# Patient Record
Sex: Male | Born: 1950 | Race: White | Hispanic: No | Marital: Married | Smoking: Never smoker
Health system: Southern US, Community
[De-identification: ages and names within clinical notes are randomized; demographics above are authoritative.]

## PROBLEM LIST (undated history)

## (undated) DIAGNOSIS — T7840XA Allergy, unspecified, initial encounter: Secondary | ICD-10-CM

## (undated) DIAGNOSIS — F419 Anxiety disorder, unspecified: Secondary | ICD-10-CM

## (undated) DIAGNOSIS — C801 Malignant (primary) neoplasm, unspecified: Secondary | ICD-10-CM

## (undated) DIAGNOSIS — M810 Age-related osteoporosis without current pathological fracture: Secondary | ICD-10-CM

## (undated) DIAGNOSIS — Z789 Other specified health status: Secondary | ICD-10-CM

## (undated) DIAGNOSIS — J45909 Unspecified asthma, uncomplicated: Secondary | ICD-10-CM

## (undated) DIAGNOSIS — K2970 Gastritis, unspecified, without bleeding: Secondary | ICD-10-CM

## (undated) HISTORY — DX: Allergy, unspecified, initial encounter: T78.40XA

## (undated) HISTORY — DX: Anxiety disorder, unspecified: F41.9

## (undated) HISTORY — DX: Malignant (primary) neoplasm, unspecified: C80.1

## (undated) HISTORY — DX: Other specified health status: Z78.9

## (undated) HISTORY — DX: Age-related osteoporosis without current pathological fracture: M81.0

## (undated) HISTORY — PX: TONSILLECTOMY: SUR1361

## (undated) HISTORY — DX: Unspecified asthma, uncomplicated: J45.909

## (undated) HISTORY — PX: CLOSED REDUCTION WRIST FRACTURE: SHX1091

## (undated) HISTORY — PX: WISDOM TOOTH EXTRACTION: SHX21

## (undated) HISTORY — DX: Gastritis, unspecified, without bleeding: K29.70

---

## 2004-05-11 ENCOUNTER — Ambulatory Visit (HOSPITAL_COMMUNITY): Admission: RE | Admit: 2004-05-11 | Discharge: 2004-05-11 | Payer: Self-pay | Admitting: Urology

## 2017-12-06 ENCOUNTER — Ambulatory Visit (INDEPENDENT_AMBULATORY_CARE_PROVIDER_SITE_OTHER): Payer: PPO | Admitting: Emergency Medicine

## 2017-12-06 ENCOUNTER — Other Ambulatory Visit: Payer: Self-pay

## 2017-12-06 ENCOUNTER — Encounter: Payer: Self-pay | Admitting: Emergency Medicine

## 2017-12-06 VITALS — BP 96/50 | HR 56 | Temp 97.9°F | Resp 16 | Ht 70.25 in | Wt 140.2 lb

## 2017-12-06 DIAGNOSIS — Z23 Encounter for immunization: Secondary | ICD-10-CM | POA: Insufficient documentation

## 2017-12-06 DIAGNOSIS — Z7689 Persons encountering health services in other specified circumstances: Secondary | ICD-10-CM | POA: Diagnosis not present

## 2017-12-06 DIAGNOSIS — Z1211 Encounter for screening for malignant neoplasm of colon: Secondary | ICD-10-CM

## 2017-12-06 NOTE — Patient Instructions (Addendum)
IF you received an x-ray today, you will receive an invoice from [facility]. Please contact [facility] at [phone] with questions or concerns regarding your invoice.   IF you received labwork today, you will receive an invoice from [facility]. Please contact [facility] at [phone] with questions or concerns regarding your invoice.   Our billing staff will not be able to assist you with questions regarding bills from these companies.  You will be contacted with the lab results as soon as they are available. The fastest way to get your results is to activate your My Chart account. Instructions are located on the last page of this paperwork. If you have not heard from [place] regarding the results in 2 weeks, please contact this office.     Hepatitis A Hepatitis A is a viral infection of the liver. The virus causes inflammation in the liver and it can be passed from person to person (is contagious). Most cases of hepatitis A are fairly mild and people recover fully. The hepatitis A vaccine can prevent this condition. What are the causes? This condition is caused by the hepatitis A virus (HAV). The virus may be spread by:  Drinking or eating unclean (contaminated) food or water.  Having sex with with someone who is infected.  Coming into contact with the stool (feces) of a person who is infected and passing the virus from your hands to your mouth.  What increases the risk? The following factors make you more likely to develop this condition:  Having contact with contaminated needles or syringes. This may happen during: ? Acupuncture. ? Tattooing. ? Body piercing. ? Injecting drugs.  Not having access to clean water or food.  Working at a day care or nursing home. Working in these facilities increases the risk of getting this infection because you are in contact with feces during diaper changes or general hygiene practices.  Having HIV (human immunodeficiency  virus) or AIDS (acquired immunodeficiency syndrome).  Living in or traveling to countries where hepatitis A is common.  Being a man who has sex with men.  Having oral or anal sex.  Having hemophilia or another blood clotting factor disorder.  Having long-term (chronic) liver disease.  What are the signs or symptoms? Symptoms of this condition include:  Loss of appetite.  Fatigue.  Nausea.  Vomiting.  Stomach pain.  Dark yellow urine.  Yellowing of the skin and eyes (jaundice).  Fever.  Itchy skin.  Light-colored bowel movements.  Joint pain.  In some cases, you may not have any symptoms. How is this diagnosed? This condition is diagnosed based on:  A physical exam.  Your medical history.  Blood tests.  How is this treated? This condition usually goes away on its own over several weeks or months. There is no specific treatment for the disease after the virus has caused the infection. Severe cases of hepatitis A may require hospitalization to treat dehydration and to monitor liver function, but this is rare. Follow these instructions at home: Medicines  Take over-the-counter and prescription medicines only as told by your health care provider.  Do not take over-the-counter medicines that contain acetaminophen.  Do not take any new medicines, including over-the-counter medicines and supplements, unless approved by your health care provider. Lifestyle  Rest. Make sure you: ? Get plenty of sleep. Avoid staying up late. ? Keep the same bedtime hours on weekends and weekdays. ? Take daytime naps or rest breaks when you feel tired.  Do  not have sex unless approved by your health care provider.  Eat a balanced diet with plenty of fruits and vegetables, whole grains, and low-fat (lean) meats or other non-meat proteins (such as beans or tofu).  Do not drink alcohol until your health care provider approves. General instructions  Wash your hands frequently  with soap and water, especially after using the bathroom, after changing diapers, and before handling food or water. If soap and water are not available, use hand sanitizer.  Tell your health care provider about all of the people you live with or with whom you have close contact. Your health care provider may recommend that they receive the hepatitis A vaccine.  Follow your health care provider's instructions about how to avoid spreading the virus.  Ask your health care provider when you may return to school or work.  Keep all follow-up visits as told by your health care provider. This is important. How is this prevented?  Get the hepatitis A vaccine. This helps prevent the hepatitis A infection.  If you have been recently exposed to hepatitis A, your health care provider may recommend that you get a shot of human immunoglobulin or the hepatitis A vaccine. This may help you prevent hepatitis A.  Wash your hands frequently with soap and water, especially after using the bathroom or changing diapers, and before handling food or water. If soap and water are not available, use hand sanitizer.  If you travel to a developing country: ? Avoid raw or under-cooked food. ? Drink bottled water only. ? Use bottled water to brush your teeth, make ice cubes, and wash fruits and vegetables.  Practice safe sex. Always use condoms when having oral, vaginal, or anal sex. Contact a health care provider if:  You have a fever.  Your symptoms get worse. Get help right away if:  You are unable to eat or drink.  You cannot eat or drink without vomiting.  You feel confused.  Your jaundice gets worse.  You are very sleepy or have trouble waking up.  You have uncontrolled bleeding or bruising. Summary  Hepatitis A is a viral infection of the liver. The virus causes inflammation in the liver and it can be passed from person to person (is contagious).  The hepatitis A virus (HAV) can be spread by  drinking or eating unclean (contaminated) food or water.  You should not take any new medicines, including over-the-counter medicines and supplements, unless they are approved by your health care provider.  To help prevent hepatitis A, wash your hands frequently with soap and water, especially after using the bathroom or changing diapers, and before handling food or water. If soap and water are not available, use hand sanitizer. This information is not intended to replace advice given to you by your health care provider. Make sure you discuss any questions you have with your health care provider. Document Released: 08/26/2000 Document Revised: 10/04/2016 Document Reviewed: 10/04/2016 Elsevier Interactive Patient Education  [employer].

## 2017-12-06 NOTE — Addendum Note (Signed)
Addended by: Alfredia Ferguson A on: 12/06/2017 04:14 PM   Modules accepted: Orders

## 2017-12-06 NOTE — Progress Notes (Signed)
Cory Thomas 67 y.o.   Chief Complaint  Patient presents with  . Establish Care  . Immunizations    per patient traveling to [place] 12/11/2017    HISTORY OF PRESENT ILLNESS: This is a 67 y.o. male healthy man on no medications here to establish care. Will be traveling soon to [place].  Needs yellow fever and hepatitis A vaccines.  Up-to-date with everything else. No chronic medical problems.  HPI   Prior to Admission medications   Medication Sig Start Date End Date Taking? Authorizing Provider  Alpha-D-Galactosidase [facility]) TABS Take by mouth as needed.   Yes [provider]    No Known Allergies  There are no active problems to display for this patient.   No past medical history on file.    Social History   Socioeconomic History  . Marital status: Married    Spouse name: Not on file  . Number of children: Not on file  . Years of education: Not on file  . Highest education level: Not on file  Occupational History  . Not on file  Social Needs  . Financial resource strain: Not on file  . Food insecurity:    Worry: Not on file    Inability: Not on file  . Transportation needs:    Medical: Not on file    Non-medical: Not on file  Tobacco Use  . Smoking status: Never Smoker  . Smokeless tobacco: Never Used  Substance and Sexual Activity  . Alcohol use: Yes    Alcohol/week: 1.8 oz    Types: 3 Glasses of wine per week    Comment: 1-2 bottles of beer/week  . Drug use: Never  . Sexual activity: Not on file  Lifestyle  . Physical activity:    Days per week: Not on file    Minutes per session: Not on file  . Stress: Not on file  Relationships  . Social connections:    Talks on phone: Not on file    Gets together: Not on file    Attends religious service: Not on file    Active member of club or organization: Not on file    Attends meetings of clubs or organizations: Not on file    Relationship status: Not on file  . Intimate  partner violence:    Fear of current or ex partner: Not on file    Emotionally abused: Not on file    Physically abused: Not on file    Forced sexual activity: Not on file  Other Topics Concern  . Not on file  Social History Narrative  . Not on file    No family history on file.   Review of Systems  Constitutional: Negative.  Negative for chills, fever and weight loss.  HENT: Negative.   Eyes: Negative.   Respiratory: Negative.  Negative for cough and shortness of breath.   Cardiovascular: Negative.  Negative for chest pain and palpitations.  Gastrointestinal: Negative.  Negative for abdominal pain, diarrhea, nausea and vomiting.  Genitourinary: Negative.   Skin: Negative.  Negative for rash.  Neurological: Negative.   All other systems reviewed and are negative.  Vitals:   12/06/17 0941  BP: (!) 96/50  Pulse: (!) 56  Resp: 16  Temp: 97.9 F (36.6 C)  SpO2: 100%     Physical Exam  Constitutional: He is oriented to person, place, and time. He appears well-developed and well-nourished.  HENT:  Head: Normocephalic and atraumatic.  Eyes: Pupils  are equal, round, and reactive to light. EOM are normal.  Neck: Normal range of motion. Neck supple.  Cardiovascular: Normal rate and regular rhythm.  Pulmonary/Chest: Effort normal and breath sounds normal.  Musculoskeletal: Normal range of motion.  Neurological: He is alert and oriented to person, place, and time.  Skin: Skin is warm and dry. Capillary refill takes less than 2 seconds.  Psychiatric: He has a normal mood and affect. His behavior is normal.  Vitals reviewed.    ASSESSMENT & PLAN: Cory Thomas was seen today for establish care and immunizations.  Diagnoses and all orders for this visit:  Need for hepatitis A vaccination -     Hepatitis A vaccine adult IM  Encounter to establish care    Patient Instructions       IF you received an x-ray today, you will receive an invoice from [facility].  Please contact [facility] at [phone] with questions or concerns regarding your invoice.   IF you received labwork today, you will receive an invoice from [place]. Please contact [facility] at [phone] with questions or concerns regarding your invoice.   Our billing staff will not be able to assist you with questions regarding bills from these companies.  You will be contacted with the lab results as soon as they are available. The fastest way to get your results is to activate your My Chart account. Instructions are located on the last page of this paperwork. If you have not heard from [place] regarding the results in 2 weeks, please contact this office.     Hepatitis A Hepatitis A is a viral infection of the liver. The virus causes inflammation in the liver and it can be passed from person to person (is contagious). Most cases of hepatitis A are fairly mild and people recover fully. The hepatitis A vaccine can prevent this condition. What are the causes? This condition is caused by the hepatitis A virus (HAV). The virus may be spread by:  Drinking or eating unclean (contaminated) food or water.  Having sex with with someone who is infected.  Coming into contact with the stool (feces) of a person who is infected and passing the virus from your hands to your mouth.  What increases the risk? The following factors make you more likely to develop this condition:  Having contact with contaminated needles or syringes. This may happen during: ? Acupuncture. ? Tattooing. ? Body piercing. ? Injecting drugs.  Not having access to clean water or food.  Working at a day care or nursing home. Working in these facilities increases the risk of getting this infection because you are in contact with feces during diaper changes or general hygiene practices.  Having HIV (human immunodeficiency virus) or AIDS (acquired immunodeficiency syndrome).  Living in or traveling to countries  where hepatitis A is common.  Being a man who has sex with men.  Having oral or anal sex.  Having hemophilia or another blood clotting factor disorder.  Having long-term (chronic) liver disease.  What are the signs or symptoms? Symptoms of this condition include:  Loss of appetite.  Fatigue.  Nausea.  Vomiting.  Stomach pain.  Dark yellow urine.  Yellowing of the skin and eyes (jaundice).  Fever.  Itchy skin.  Light-colored bowel movements.  Joint pain.  In some cases, you may not have any symptoms. How is this diagnosed? This condition is diagnosed based on:  A physical exam.  Your medical history.  Blood tests.  How is this treated?  This condition usually goes away on its own over several weeks or months. There is no specific treatment for the disease after the virus has caused the infection. Severe cases of hepatitis A may require hospitalization to treat dehydration and to monitor liver function, but this is rare. Follow these instructions at home: Medicines  Take over-the-counter and prescription medicines only as told by your health care provider.  Do not take over-the-counter medicines that contain acetaminophen.  Do not take any new medicines, including over-the-counter medicines and supplements, unless approved by your health care provider. Lifestyle  Rest. Make sure you: ? Get plenty of sleep. Avoid staying up late. ? Keep the same bedtime hours on weekends and weekdays. ? Take daytime naps or rest breaks when you feel tired.  Do not have sex unless approved by your health care provider.  Eat a balanced diet with plenty of fruits and vegetables, whole grains, and low-fat (lean) meats or other non-meat proteins (such as beans or tofu).  Do not drink alcohol until your health care provider approves. General instructions  Wash your hands frequently with soap and water, especially after using the bathroom, after changing diapers, and before  handling food or water. If soap and water are not available, use hand sanitizer.  Tell your health care provider about all of the people you live with or with whom you have close contact. Your health care provider may recommend that they receive the hepatitis A vaccine.  Follow your health care provider's instructions about how to avoid spreading the virus.  Ask your health care provider when you may return to school or work.  Keep all follow-up visits as told by your health care provider. This is important. How is this prevented?  Get the hepatitis A vaccine. This helps prevent the hepatitis A infection.  If you have been recently exposed to hepatitis A, your health care provider may recommend that you get a shot of human immunoglobulin or the hepatitis A vaccine. This may help you prevent hepatitis A.  Wash your hands frequently with soap and water, especially after using the bathroom or changing diapers, and before handling food or water. If soap and water are not available, use hand sanitizer.  If you travel to a developing country: ? Avoid raw or under-cooked food. ? Drink bottled water only. ? Use bottled water to brush your teeth, make ice cubes, and wash fruits and vegetables.  Practice safe sex. Always use condoms when having oral, vaginal, or anal sex. Contact a health care provider if:  You have a fever.  Your symptoms get worse. Get help right away if:  You are unable to eat or drink.  You cannot eat or drink without vomiting.  You feel confused.  Your jaundice gets worse.  You are very sleepy or have trouble waking up.  You have uncontrolled bleeding or bruising. Summary  Hepatitis A is a viral infection of the liver. The virus causes inflammation in the liver and it can be passed from person to person (is contagious).  The hepatitis A virus (HAV) can be spread by drinking or eating unclean (contaminated) food or water.  You should not take any new  medicines, including over-the-counter medicines and supplements, unless they are approved by your health care provider.  To help prevent hepatitis A, wash your hands frequently with soap and water, especially after using the bathroom or changing diapers, and before handling food or water. If soap and water are not available, use hand sanitizer.  This information is not intended to replace advice given to you by your health care provider. Make sure you discuss any questions you have with your health care provider. Document Released: 08/26/2000 Document Revised: 10/04/2016 Document Reviewed: 10/04/2016 Elsevier Interactive Patient Education  2018 [employer]      Agustina Caroli, MD [facility]

## 2018-01-09 ENCOUNTER — Encounter: Payer: Self-pay | Admitting: Emergency Medicine

## 2018-01-09 ENCOUNTER — Other Ambulatory Visit: Payer: Self-pay

## 2018-01-09 ENCOUNTER — Ambulatory Visit (INDEPENDENT_AMBULATORY_CARE_PROVIDER_SITE_OTHER): Payer: PPO | Admitting: Emergency Medicine

## 2018-01-09 VITALS — BP 98/60 | HR 63 | Temp 98.1°F | Resp 16 | Ht 70.25 in | Wt 140.2 lb

## 2018-01-09 DIAGNOSIS — Z0000 Encounter for general adult medical examination without abnormal findings: Secondary | ICD-10-CM

## 2018-01-09 NOTE — Patient Instructions (Addendum)
   IF you received an x-ray today, you will receive an invoice from [facility]. Please contact [facility] at [phone] with questions or concerns regarding your invoice.   IF you received labwork today, you will receive an invoice from [facility]. Please contact [facility] at [phone] with questions or concerns regarding your invoice.   Our billing staff will not be able to assist you with questions regarding bills from these companies.  You will be contacted with the lab results as soon as they are available. The fastest way to get your results is to activate your My Chart account. Instructions are located on the last page of this paperwork. If you have not heard from us regarding the results in 2 weeks, please contact this office.      Health Maintenance, Male A healthy lifestyle and preventive care is important for your health and wellness. Ask your health care provider about what schedule of regular examinations is right for you. What should I know about weight and diet? Eat a Healthy Diet  Eat plenty of vegetables, fruits, whole grains, low-fat dairy products, and lean protein.  Do not eat a lot of foods high in solid fats, added sugars, or salt.  Maintain a Healthy Weight Regular exercise can help you achieve or maintain a healthy weight. You should:  Do at least 150 minutes of exercise each week. The exercise should increase your heart rate and make you sweat (moderate-intensity exercise).  Do strength-training exercises at least twice a week.  Watch Your Levels of Cholesterol and Blood Lipids  Have your blood tested for lipids and cholesterol every 5 years starting at 67 years of age. If you are at high risk for heart disease, you should start having your blood tested when you are 67 years old. You may need to have your cholesterol levels checked more often if: ? Your lipid or cholesterol levels are high. ? You are older than 67 years of age. ? You  are at high risk for heart disease.  What should I know about cancer screening? Many types of cancers can be detected early and may often be prevented. Lung Cancer  You should be screened every year for lung cancer if: ? You are a current smoker who has smoked for at least 30 years. ? You are a former smoker who has quit within the past 15 years.  Talk to your health care provider about your screening options, when you should start screening, and how often you should be screened.  Colorectal Cancer  Routine colorectal cancer screening usually begins at 67 years of age and should be repeated every 5-10 years until you are 67 years old. You may need to be screened more often if early forms of precancerous polyps or small growths are found. Your health care provider may recommend screening at an earlier age if you have risk factors for colon cancer.  Your health care provider may recommend using home test kits to check for hidden blood in the stool.  A small camera at the end of a tube can be used to examine your colon (sigmoidoscopy or colonoscopy). This checks for the earliest forms of colorectal cancer.  Prostate and Testicular Cancer  Depending on your age and overall health, your health care provider may do certain tests to screen for prostate and testicular cancer.  Talk to your health care provider about any symptoms or concerns you have about testicular or prostate cancer.  Skin Cancer  Check your skin   from head to toe regularly.  Tell your health care provider about any new moles or changes in moles, especially if: ? There is a change in a mole's size, shape, or color. ? You have a mole that is larger than a pencil eraser.  Always use sunscreen. Apply sunscreen liberally and repeat throughout the day.  Protect yourself by wearing long sleeves, pants, a wide-brimmed hat, and sunglasses when outside.  What should I know about heart disease, diabetes, and high blood  pressure?  If you are 18-39 years of age, have your blood pressure checked every 3-5 years. If you are 40 years of age or older, have your blood pressure checked every year. You should have your blood pressure measured twice-once when you are at a hospital or clinic, and once when you are not at a hospital or clinic. Record the average of the two measurements. To check your blood pressure when you are not at a hospital or clinic, you can use: ? An automated blood pressure machine at a pharmacy. ? A home blood pressure monitor.  Talk to your health care provider about your target blood pressure.  If you are between 45-79 years old, ask your health care provider if you should take aspirin to prevent heart disease.  Have regular diabetes screenings by checking your fasting blood sugar level. ? If you are at a normal weight and have a low risk for diabetes, have this test once every three years after the age of 45. ? If you are overweight and have a high risk for diabetes, consider being tested at a younger age or more often.  A one-time screening for abdominal aortic aneurysm (AAA) by ultrasound is recommended for men aged 65-75 years who are current or former smokers. What should I know about preventing infection? Hepatitis B If you have a higher risk for hepatitis B, you should be screened for this virus. Talk with your health care provider to find out if you are at risk for hepatitis B infection. Hepatitis C Blood testing is recommended for:  Everyone born from 1945 through 1965.  Anyone with known risk factors for hepatitis C.  Sexually Transmitted Diseases (STDs)  You should be screened each year for STDs including gonorrhea and chlamydia if: ? You are sexually active and are younger than 67 years of age. ? You are older than 67 years of age and your health care provider tells you that you are at risk for this type of infection. ? Your sexual activity has changed since you were last  screened and you are at an increased risk for chlamydia or gonorrhea. Ask your health care provider if you are at risk.  Talk with your health care provider about whether you are at high risk of being infected with HIV. Your health care provider may recommend a prescription medicine to help prevent HIV infection.  What else can I do?  Schedule regular health, dental, and eye exams.  Stay current with your vaccines (immunizations).  Do not use any tobacco products, such as cigarettes, chewing tobacco, and e-cigarettes. If you need help quitting, ask your health care provider.  Limit alcohol intake to no more than 2 drinks per day. One drink equals 12 ounces of beer, 5 ounces of wine, or 1 ounces of hard liquor.  Do not use street drugs.  Do not share needles.  Ask your health care provider for help if you need support or information about quitting drugs.  Tell your health care   provider if you often feel depressed.  Tell your health care provider if you have ever been abused or do not feel safe at home. This information is not intended to replace advice given to you by your health care provider. Make sure you discuss any questions you have with your health care provider. Document Released: 02/25/2008 Document Revised: 04/27/2016 Document Reviewed: 06/02/2015 Elsevier Interactive Patient Education  2018 Elsevier Inc.  

## 2018-01-09 NOTE — Progress Notes (Signed)
Cory Thomas 67 y.o.   Chief Complaint  Patient presents with  . Annual Exam    HISTORY OF PRESENT ILLNESS: This is a 67 y.o. male Here for annual exam; no complaints and no medical concerns.  No chronic medical problems.  Healthy male.  Physically active.  Non-smoker.  Eats well.  Diet mostly fruits, vegetables, poultry, and fish.  No medications.   HPI   Prior to Admission medications   Medication Sig Start Date End Date Taking? Authorizing Provider  Alpha-D-Galactosidase [facility]) TABS Take by mouth as needed.   Yes [provider]    No Known Allergies  Patient Active Problem List   Diagnosis Date Noted  . Need for hepatitis A vaccination 12/06/2017    No past medical history on file.  No past surgical history on file.  Social History   Socioeconomic History  . Marital status: Married    Spouse name: Not on file  . Number of children: Not on file  . Years of education: Not on file  . Highest education level: Not on file  Occupational History  . Not on file  Social Needs  . Financial resource strain: Not on file  . Food insecurity:    Worry: Not on file    Inability: Not on file  . Transportation needs:    Medical: Not on file    Non-medical: Not on file  Tobacco Use  . Smoking status: Never Smoker  . Smokeless tobacco: Never Used  Substance and Sexual Activity  . Alcohol use: Yes    Alcohol/week: 1.8 oz    Types: 3 Glasses of wine per week    Comment: 1-2 bottles of beer/week  . Drug use: Never  . Sexual activity: Not on file  Lifestyle  . Physical activity:    Days per week: Not on file    Minutes per session: Not on file  . Stress: Not on file  Relationships  . Social connections:    Talks on phone: Not on file    Gets together: Not on file    Attends religious service: Not on file    Active member of club or organization: Not on file    Attends meetings of clubs or organizations: Not on file    Relationship status: Not on file   . Intimate partner violence:    Fear of current or ex partner: Not on file    Emotionally abused: Not on file    Physically abused: Not on file    Forced sexual activity: Not on file  Other Topics Concern  . Not on file  Social History Narrative  . Not on file    No family history on file.   Review of Systems  Constitutional: Negative.  Negative for chills, fever and weight loss.  HENT: Negative.  Negative for congestion, hearing loss, nosebleeds and sore throat.   Eyes: Negative.  Negative for blurred vision, double vision, discharge and redness.  Respiratory: Negative.  Negative for cough and shortness of breath.   Cardiovascular: Negative.  Negative for chest pain, palpitations and leg swelling.  Gastrointestinal: Negative.  Negative for abdominal pain, blood in stool, diarrhea, melena, nausea and vomiting.  Genitourinary: Negative.  Negative for dysuria and hematuria.  Musculoskeletal: Negative.  Negative for myalgias and neck pain.  Skin: Negative.  Negative for rash.  Neurological: Negative.  Negative for dizziness and headaches.  Endo/Heme/Allergies: Negative.   All other systems reviewed and are negative.     Vitals:  01/09/18 0809  BP: 98/60  Pulse: 63  Resp: 16  Temp: 98.1 F (36.7 C)  SpO2: 99%    Physical Exam  Constitutional: He is oriented to person, place, and time. He appears well-developed and well-nourished.  HENT:  Head: Normocephalic and atraumatic.  Right Ear: External ear normal.  Left Ear: External ear normal.  Nose: Nose normal.  Mouth/Throat: Oropharynx is clear and moist.  Eyes: Pupils are equal, round, and reactive to light. Conjunctivae and EOM are normal.  Neck: Normal range of motion. Neck supple.  Cardiovascular: Normal rate, regular rhythm, normal heart sounds and intact distal pulses.  Pulmonary/Chest: Effort normal and breath sounds normal.  Abdominal: Soft. Bowel sounds are normal. He exhibits no distension and no mass. There  is no tenderness.  Musculoskeletal: Normal range of motion. He exhibits no edema.  Neurological: He is alert and oriented to person, place, and time. No sensory deficit. He exhibits normal muscle tone. Coordination normal.  Skin: Skin is warm and dry. Capillary refill takes less than 2 seconds. No rash noted.  Psychiatric: He has a normal mood and affect. His behavior is normal.  Vitals reviewed.    ASSESSMENT & PLAN: Cory Thomas was seen today for annual exam.  Diagnoses and all orders for this visit:  Routine general medical examination at a health care facility -     CBC with Differential -     Comprehensive metabolic panel -     Hemoglobin A1c -     Lipid panel -     PSA(Must document that pt has been informed of limitations of PSA testing.) -     Hepatitis C antibody screen -     Ambulatory referral to Gastroenterology    Patient Instructions       IF you received an x-ray today, you will receive an invoice from [facility]. Please contact [facility] at [phone] with questions or concerns regarding your invoice.   IF you received labwork today, you will receive an invoice from [facility]. Please contact [facility] at [phone] with questions or concerns regarding your invoice.   Our billing staff will not be able to assist you with questions regarding bills from these companies.  You will be contacted with the lab results as soon as they are available. The fastest way to get your results is to activate your My Chart account. Instructions are located on the last page of this paperwork. If you have not heard from [place] regarding the results in 2 weeks, please contact this office.      Health Maintenance, Male A healthy lifestyle and preventive care is important for your health and wellness. Ask your health care provider about what schedule of regular examinations is right for you. What should I know about weight and diet? Eat a Healthy Diet  Eat plenty  of vegetables, fruits, whole grains, low-fat dairy products, and lean protein.  Do not eat a lot of foods high in solid fats, added sugars, or salt.  Maintain a Healthy Weight Regular exercise can help you achieve or maintain a healthy weight. You should:  Do at least 150 minutes of exercise each week. The exercise should increase your heart rate and make you sweat (moderate-intensity exercise).  Do strength-training exercises at least twice a week.  Watch Your Levels of Cholesterol and Blood Lipids  Have your blood tested for lipids and cholesterol every 5 years starting at 67 years of age. If you are at high risk for heart disease, you should  start having your blood tested when you are 67 years old. You may need to have your cholesterol levels checked more often if: ? Your lipid or cholesterol levels are high. ? You are older than 67 years of age. ? You are at high risk for heart disease.  What should I know about cancer screening? Many types of cancers can be detected early and may often be prevented. Lung Cancer  You should be screened every year for lung cancer if: ? You are a current smoker who has smoked for at least 30 years. ? You are a former smoker who has quit within the past 15 years.  Talk to your health care provider about your screening options, when you should start screening, and how often you should be screened.  Colorectal Cancer  Routine colorectal cancer screening usually begins at 67 years of age and should be repeated every 5-10 years until you are 67 years old. You may need to be screened more often if early forms of precancerous polyps or small growths are found. Your health care provider may recommend screening at an earlier age if you have risk factors for colon cancer.  Your health care provider may recommend using home test kits to check for hidden blood in the stool.  A small camera at the end of a tube can be used to examine your colon (sigmoidoscopy  or colonoscopy). This checks for the earliest forms of colorectal cancer.  Prostate and Testicular Cancer  Depending on your age and overall health, your health care provider may do certain tests to screen for prostate and testicular cancer.  Talk to your health care provider about any symptoms or concerns you have about testicular or prostate cancer.  Skin Cancer  Check your skin from head to toe regularly.  Tell your health care provider about any new moles or changes in moles, especially if: ? There is a change in a mole's size, shape, or color. ? You have a mole that is larger than a pencil eraser.  Always use sunscreen. Apply sunscreen liberally and repeat throughout the day.  Protect yourself by wearing long sleeves, pants, a wide-brimmed hat, and sunglasses when outside.  What should I know about heart disease, diabetes, and high blood pressure?  If you are 59-22 years of age, have your blood pressure checked every 3-5 years. If you are 61 years of age or older, have your blood pressure checked every year. You should have your blood pressure measured twice-once when you are at a hospital or clinic, and once when you are not at a hospital or clinic. Record the average of the two measurements. To check your blood pressure when you are not at a hospital or clinic, you can use: ? An automated blood pressure machine at a pharmacy. ? A home blood pressure monitor.  Talk to your health care provider about your target blood pressure.  If you are between 80-88 years old, ask your health care provider if you should take aspirin to prevent heart disease.  Have regular diabetes screenings by checking your fasting blood sugar level. ? If you are at a normal weight and have a low risk for diabetes, have this test once every three years after the age of 23. ? If you are overweight and have a high risk for diabetes, consider being tested at a younger age or more often.  A one-time screening  for abdominal aortic aneurysm (AAA) by ultrasound is recommended for men aged 29-75 years  who are current or former smokers. What should I know about preventing infection? Hepatitis B If you have a higher risk for hepatitis B, you should be screened for this virus. Talk with your health care provider to find out if you are at risk for hepatitis B infection. Hepatitis C Blood testing is recommended for:  Everyone born from [birth year] through 1965.  Anyone with known risk factors for hepatitis C.  Sexually Transmitted Diseases (STDs)  You should be screened each year for STDs including gonorrhea and chlamydia if: ? You are sexually active and are younger than 67 years of age. ? You are older than 67 years of age and your health care provider tells you that you are at risk for this type of infection. ? Your sexual activity has changed since you were last screened and you are at an increased risk for chlamydia or gonorrhea. Ask your health care provider if you are at risk.  Talk with your health care provider about whether you are at high risk of being infected with HIV. Your health care provider may recommend a prescription medicine to help prevent HIV infection.  What else can I do?  Schedule regular health, dental, and eye exams.  Stay current with your vaccines (immunizations).  Do not use any tobacco products, such as cigarettes, chewing tobacco, and e-cigarettes. If you need help quitting, ask your health care provider.  Limit alcohol intake to no more than 2 drinks per day. One drink equals 12 ounces of beer, 5 ounces of wine, or 1 ounces of hard liquor.  Do not use street drugs.  Do not share needles.  Ask your health care provider for help if you need support or information about quitting drugs.  Tell your health care provider if you often feel depressed.  Tell your health care provider if you have ever been abused or do not feel safe at home. This information is not intended  to replace advice given to you by your health care provider. Make sure you discuss any questions you have with your health care provider. Document Released: 02/25/2008 Document Revised: 04/27/2016 Document Reviewed: 06/02/2015 Elsevier Interactive Patient Education  2018 Elsevier Inc.      Agustina Caroli, MD [facility]

## 2018-01-10 ENCOUNTER — Encounter: Payer: Self-pay | Admitting: Radiology

## 2018-01-10 LAB — LIPID PANEL
CHOLESTEROL TOTAL: 125 mg/dL (ref 100–199)
Chol/HDL Ratio: 2.6 ratio (ref 0.0–5.0)
HDL: 49 mg/dL (ref >39)
LDL Calculated: 68 mg/dL (ref 0–99)
Triglycerides: 41 mg/dL (ref 0–149)
VLDL CHOLESTEROL CAL: 8 mg/dL (ref 5–40)

## 2018-01-10 LAB — CBC WITH DIFFERENTIAL/PLATELET
BASOS ABS: 0 10*3/uL (ref 0.0–0.2)
Basos: 1 %
EOS (ABSOLUTE): 0.1 10*3/uL (ref 0.0–0.4)
Eos: 2 %
Hematocrit: 44.2 % (ref 37.5–51.0)
Hemoglobin: 15 g/dL (ref 13.0–17.7)
IMMATURE GRANULOCYTES: 0 %
Immature Grans (Abs): 0 10*3/uL (ref 0.0–0.1)
Lymphocytes Absolute: 1 10*3/uL (ref 0.7–3.1)
Lymphs: 17 %
MCH: 31.2 pg (ref 26.6–33.0)
MCHC: 33.9 g/dL (ref 31.5–35.7)
MCV: 92 fL (ref 79–97)
MONOS ABS: 0.6 10*3/uL (ref 0.1–0.9)
Monocytes: 9 %
NEUTROS PCT: 71 %
Neutrophils Absolute: 4.3 10*3/uL (ref 1.4–7.0)
PLATELETS: 230 10*3/uL (ref 150–379)
RBC: 4.81 x10E6/uL (ref 4.14–5.80)
RDW: 13.3 % (ref 12.3–15.4)
WBC: 6.1 10*3/uL (ref 3.4–10.8)

## 2018-01-10 LAB — COMPREHENSIVE METABOLIC PANEL
ALK PHOS: 85 IU/L (ref 39–117)
ALT: 26 IU/L (ref 0–44)
AST: 33 IU/L (ref 0–40)
Albumin/Globulin Ratio: 1.7 (ref 1.2–2.2)
Albumin: 3.8 g/dL (ref 3.6–4.8)
BUN/Creatinine Ratio: 21 (ref 10–24)
BUN: 19 mg/dL (ref 8–27)
Bilirubin Total: 0.5 mg/dL (ref 0.0–1.2)
CALCIUM: 9 mg/dL (ref 8.6–10.2)
CO2: 25 mmol/L (ref 20–29)
CREATININE: 0.92 mg/dL (ref 0.76–1.27)
Chloride: 103 mmol/L (ref 96–106)
GFR calc Af Amer: 100 mL/min/{1.73_m2} (ref >59)
GFR, EST NON AFRICAN AMERICAN: 86 mL/min/{1.73_m2} (ref >59)
Globulin, Total: 2.2 g/dL (ref 1.5–4.5)
Glucose: 89 mg/dL (ref 65–99)
Potassium: 4.2 mmol/L (ref 3.5–5.2)
Sodium: 141 mmol/L (ref 134–144)
Total Protein: 6 g/dL (ref 6.0–8.5)

## 2018-01-10 LAB — HEPATITIS C ANTIBODY: Hep C Virus Ab: <0.1 s/co ratio (ref 0.0–0.9)

## 2018-01-10 LAB — HEMOGLOBIN A1C
ESTIMATED AVERAGE GLUCOSE: 117 mg/dL
HEMOGLOBIN A1C: 5.7 % — AB (ref 4.8–5.6)

## 2018-01-10 LAB — PSA: Prostate Specific Ag, Serum: 2.2 ng/mL (ref 0.0–4.0)

## 2018-01-17 ENCOUNTER — Encounter: Payer: Self-pay | Admitting: Gastroenterology

## 2018-01-29 ENCOUNTER — Telehealth: Payer: Self-pay | Admitting: Emergency Medicine

## 2018-01-29 NOTE — Telephone Encounter (Signed)
Pt called concerned about his HgbA1C of 5.7. Pt states he is very active, eats well, avoids sugar and is actively involved with his health.  Pt asked for dietary guidelines that he can follow. Referred pt to [email or web address] for issues.

## 2018-03-14 ENCOUNTER — Other Ambulatory Visit: Payer: Self-pay

## 2018-03-14 ENCOUNTER — Ambulatory Visit (AMBULATORY_SURGERY_CENTER): Payer: Self-pay

## 2018-03-14 VITALS — Ht 70.5 in | Wt 140.6 lb

## 2018-03-14 DIAGNOSIS — Z1211 Encounter for screening for malignant neoplasm of colon: Secondary | ICD-10-CM

## 2018-03-14 NOTE — Progress Notes (Signed)
No egg or soy allergy known to patient  No issues with past sedation with any surgeries  or procedures, no intubation problems  No diet pills per patient No home 02 use per patient  No blood thinners per patient  Pt denies issues with constipation  No A fib or A flutter  EMMI video sent to pt's e mail sent to email

## 2018-03-21 ENCOUNTER — Encounter: Payer: Self-pay | Admitting: Gastroenterology

## 2018-03-29 ENCOUNTER — Ambulatory Visit (AMBULATORY_SURGERY_CENTER): Payer: PPO | Admitting: Gastroenterology

## 2018-03-29 ENCOUNTER — Encounter: Payer: Self-pay | Admitting: Gastroenterology

## 2018-03-29 ENCOUNTER — Other Ambulatory Visit: Payer: Self-pay

## 2018-03-29 VITALS — BP 92/43 | HR 56 | Temp 98.4°F | Resp 18 | Ht 70.0 in | Wt 140.0 lb

## 2018-03-29 DIAGNOSIS — D123 Benign neoplasm of transverse colon: Secondary | ICD-10-CM | POA: Diagnosis not present

## 2018-03-29 DIAGNOSIS — Z1211 Encounter for screening for malignant neoplasm of colon: Secondary | ICD-10-CM

## 2018-03-29 MED ORDER — SODIUM CHLORIDE 0.9 % IV SOLN: 500.0000 mL | Freq: Once | INTRAVENOUS | Status: DC

## 2018-03-29 NOTE — Patient Instructions (Signed)
Impression/Recommendations:  Polyp handout given to patient. Diverticulosis handout given to patient. Hemorrhoid handout given to patient.  Resume previous diet. Continue present medications.  Await pathology results. Repeat colonoscopy recommended for surveillance.  Date to be determined after pathology results reviewed.  YOU HAD AN ENDOSCOPIC PROCEDURE TODAY AT [facility]:   Refer to the procedure report that was given to you for any specific questions about what was found during the examination.  If the procedure report does not answer your questions, please call your gastroenterologist to clarify.  If you requested that your care partner not be given the details of your procedure findings, then the procedure report has been included in a sealed envelope for you to review at your convenience later.  YOU SHOULD EXPECT: Some feelings of bloating in the abdomen. Passage of more gas than usual.  Walking can help get rid of the air that was put into your GI tract during the procedure and reduce the bloating. If you had a lower endoscopy (such as a colonoscopy or flexible sigmoidoscopy) you may notice spotting of blood in your stool or on the toilet paper. If you underwent a bowel prep for your procedure, you may not have a normal bowel movement for a few days.  Please Note:  You might notice some irritation and congestion in your nose or some drainage.  This is from the oxygen used during your procedure.  There is no need for concern and it should clear up in a day or so.  SYMPTOMS TO REPORT IMMEDIATELY:   Following lower endoscopy (colonoscopy or flexible sigmoidoscopy):  Excessive amounts of blood in the stool  Significant tenderness or worsening of abdominal pains  Swelling of the abdomen that is new, acute  Fever of 100F or higher   For urgent or emergent issues, a gastroenterologist can be reached at any hour by calling [phone].   DIET:  We do recommend a  small meal at first, but then you may proceed to your regular diet.  Drink plenty of fluids but you should avoid alcoholic beverages for 24 hours.  ACTIVITY:  You should plan to take it easy for the rest of today and you should NOT DRIVE or use heavy machinery until tomorrow (because of the sedation medicines used during the test).    FOLLOW UP: Our staff will call the number listed on your records the next business day following your procedure to check on you and address any questions or concerns that you may have regarding the information given to you following your procedure. If we do not reach you, we will leave a message.  However, if you are feeling well and you are not experiencing any problems, there is no need to return our call.  We will assume that you have returned to your regular daily activities without incident.  If any biopsies were taken you will be contacted by phone or by letter within the next 1-3 weeks.  Please call us at [phone] if you have not heard about the biopsies in 3 weeks.    SIGNATURES/CONFIDENTIALITY: You and/or your care partner have signed paperwork which will be entered into your electronic medical record.  These signatures attest to the fact that that the information above on your After Visit Summary has been reviewed and is understood.  Full responsibility of the confidentiality of this discharge information lies with you and/or your care-partner.

## 2018-03-29 NOTE — Progress Notes (Signed)
Called to room to assist during endoscopic procedure.  Patient ID and intended procedure confirmed with present staff. Received instructions for my participation in the procedure from the performing physician.  

## 2018-03-29 NOTE — Progress Notes (Signed)
A and O x3. Report to RN. Tolerated MAC anesthesia well.

## 2018-03-29 NOTE — Op Note (Signed)
[facility] Patient Name: Cory Thomas Procedure Date: 03/29/2018 12:08 PM MRN: [id number] Endoscopist: Remo Lipps P. Havery Moros , MD Age: 67 Referring MD:  Date of Birth: July 16, 1951 Gender: Male Account #: [id number] Procedure:                Colonoscopy Indications:              Screening for colorectal malignant neoplasm, This                            is the patient's first colonoscopy Medicines:                Monitored Anesthesia Care Procedure:                Pre-Anesthesia Assessment:                           - Prior to the procedure, a History and Physical                            was performed, and patient medications and                            allergies were reviewed. The patient's tolerance of                            previous anesthesia was also reviewed. The risks                            and benefits of the procedure and the sedation                            options and risks were discussed with the patient.                            All questions were answered, and informed consent                            was obtained. Prior Anticoagulants: The patient has                            taken no previous anticoagulant or antiplatelet                            agents. ASA Grade Assessment: II - A patient with                            mild systemic disease. After reviewing the risks                            and benefits, the patient was deemed in                            satisfactory condition to undergo the procedure.  After obtaining informed consent, the colonoscope                            was passed under direct vision. Throughout the                            procedure, the patient's blood pressure, pulse, and                            oxygen saturations were monitored continuously. The                            Colonoscope was introduced through the anus and                            advanced to the the  cecum, identified by                            appendiceal orifice and ileocecal valve. The                            colonoscopy was performed without difficulty. The                            patient tolerated the procedure well. The quality                            of the bowel preparation was adequate. The                            ileocecal valve, appendiceal orifice, and rectum                            were photographed. Scope In: 12:10:48 PM Scope Out: 12:31:05 PM Scope Withdrawal Time: 0 hours 15 minutes 48 seconds  Total Procedure Duration: 0 hours 20 minutes 17 seconds  Findings:                 The perianal and digital rectal examinations were                            normal.                           A 3 to 4 mm polyp was found in the transverse                            colon. The polyp was sessile. The polyp was removed                            with a cold snare. Resection and retrieval were                            complete.  A few small-mouthed diverticula were found in the                            sigmoid colon.                           The colon was tortuous.                           Internal hemorrhoids were found during                            retroflexion. The hemorrhoids were small.                           The exam was otherwise without abnormality. Complications:            No immediate complications. Estimated blood loss:                            Minimal. Estimated Blood Loss:     Estimated blood loss was minimal. Impression:               - One 3 to 4 mm polyp in the transverse colon,                            removed with a cold snare. Resected and retrieved.                           - Diverticulosis in the sigmoid colon.                           - Tortuous colon.                           - Internal hemorrhoids.                           - The examination was otherwise normal. Recommendation:           -  Patient has a contact number available for                            emergencies. The signs and symptoms of potential                            delayed complications were discussed with the                            patient. Return to normal activities tomorrow.                            Written discharge instructions were provided to the                            patient.                           -  Resume previous diet.                           - Continue present medications.                           - Await pathology results.                           - Repeat colonoscopy for surveillance based on                            pathology results. Remo Lipps P. Armbruster, MD 03/29/2018 12:35:18 PM This report has been signed electronically.

## 2018-04-02 ENCOUNTER — Telehealth: Payer: Self-pay

## 2018-04-02 NOTE — Telephone Encounter (Signed)
  Follow up Call-  Call back number 03/29/2018  Post procedure Call Back phone  # [phone]  Some recent data might be hidden     Patient questions:  Do you have a fever, pain , or abdominal swelling? No. Pain Score  0 *  Have you tolerated food without any problems? Yes.    Have you been able to return to your normal activities? Yes.    Do you have any questions about your discharge instructions: Diet   No. Medications  No. Follow up visit  No.  Do you have questions or concerns about your Care? No.  Actions: * If pain score is 4 or above: No action needed, pain <4.

## 2018-04-02 NOTE — Telephone Encounter (Signed)
  Follow up Call-  Call back number 03/29/2018  Post procedure Call Back phone  # [phone]  Some recent data might be hidden     Patient questions:  Do you have a fever, pain , or abdominal swelling? No. Pain Score  0 *  Have you tolerated food without any problems? Yes.    Have you been able to return to your normal activities? Yes.    Do you have any questions about your discharge instructions: Diet   No. Medications  No. Follow up visit  No.  Do you have questions or concerns about your Care? No.  Actions: * If pain score is 4 or above: No action needed, pain <4.

## 2018-05-31 ENCOUNTER — Other Ambulatory Visit: Payer: Self-pay

## 2018-05-31 DIAGNOSIS — L820 Inflamed seborrheic keratosis: Secondary | ICD-10-CM | POA: Diagnosis not present

## 2018-05-31 DIAGNOSIS — D0439 Carcinoma in situ of skin of other parts of face: Secondary | ICD-10-CM | POA: Diagnosis not present

## 2018-05-31 DIAGNOSIS — L570 Actinic keratosis: Secondary | ICD-10-CM | POA: Diagnosis not present

## 2018-05-31 DIAGNOSIS — L409 Psoriasis, unspecified: Secondary | ICD-10-CM | POA: Diagnosis not present

## 2018-05-31 DIAGNOSIS — D485 Neoplasm of uncertain behavior of skin: Secondary | ICD-10-CM | POA: Diagnosis not present

## 2018-07-11 ENCOUNTER — Telehealth: Payer: Self-pay | Admitting: Emergency Medicine

## 2018-07-11 NOTE — Telephone Encounter (Signed)
Copied from [place] [id number]. Topic: General - Other >> Jul 11, 2018 11:47 AM Ivar Drape wrote: Reason for CRM:   Patient wanted the provider to know that he received his Pneumonia Shot - Prevnar 13 on 09/10/16 from another provider, and he would like this updated in his chart.

## 2018-07-12 ENCOUNTER — Telehealth: Payer: Self-pay

## 2018-07-12 NOTE — Telephone Encounter (Signed)
Entered date of Prevnar 13 in chart. My chart message to pt to ask where he received the vaccine.

## 2018-07-12 NOTE — Telephone Encounter (Signed)
Sent mychart message to pt with question on where he was given the Prevnar 13

## 2018-08-06 DIAGNOSIS — D0439 Carcinoma in situ of skin of other parts of face: Secondary | ICD-10-CM | POA: Diagnosis not present

## 2018-12-12 HISTORY — PX: POLYPECTOMY: SHX149

## 2019-01-14 ENCOUNTER — Other Ambulatory Visit: Payer: Self-pay

## 2019-01-14 ENCOUNTER — Ambulatory Visit (INDEPENDENT_AMBULATORY_CARE_PROVIDER_SITE_OTHER): Payer: PPO | Admitting: Emergency Medicine

## 2019-01-14 VITALS — BP 98/60 | Ht 70.0 in | Wt 140.0 lb

## 2019-01-14 DIAGNOSIS — Z0000 Encounter for general adult medical examination without abnormal findings: Secondary | ICD-10-CM | POA: Diagnosis not present

## 2019-01-14 NOTE — Patient Instructions (Addendum)
Thank you for taking time to come for your Medicare Wellness Visit. I appreciate your ongoing commitment to your health goals. Please review the following plan we discussed and let me know if I can assist you in the future.  Leroy Kennedy LPN   Healthy Eating Following a healthy eating pattern may help you to achieve and maintain a healthy body weight, reduce the risk of chronic disease, and live a long and productive life. It is important to follow a healthy eating pattern at an appropriate calorie level for your body. Your nutritional needs should be met primarily through food by choosing a variety of nutrient-rich foods. What are tips for following this plan? Reading food labels  Read labels and choose the following: ? Reduced or low sodium. ? Juices with 100% fruit juice. ? Foods with low saturated fats and high polyunsaturated and monounsaturated fats. ? Foods with whole grains, such as whole wheat, cracked wheat, brown rice, and wild rice. ? Whole grains that are fortified with folic acid. This is recommended for women who are pregnant or who want to become pregnant.  Read labels and avoid the following: ? Foods with a lot of added sugars. These include foods that contain brown sugar, corn sweetener, corn syrup, dextrose, fructose, glucose, high-fructose corn syrup, honey, invert sugar, lactose, malt syrup, maltose, molasses, raw sugar, sucrose, trehalose, or turbinado sugar.  Do not eat more than the following amounts of added sugar per day:  6 teaspoons (25 g) for women.  9 teaspoons (38 g) for men. ? Foods that contain processed or refined starches and grains. ? Refined grain products, such as white flour, degermed cornmeal, white bread, and white rice. Shopping  Choose nutrient-rich snacks, such as vegetables, whole fruits, and nuts. Avoid high-calorie and high-sugar snacks, such as potato chips, fruit snacks, and candy.  Use oil-based dressings and spreads on foods instead of  solid fats such as butter, stick margarine, or cream cheese.  Limit pre-made sauces, mixes, and "instant" products such as flavored rice, instant noodles, and ready-made pasta.  Try more plant-protein sources, such as tofu, tempeh, black beans, edamame, lentils, nuts, and seeds.  Explore eating plans such as the Mediterranean diet or vegetarian diet. Cooking  Use oil to saut or stir-fry foods instead of solid fats such as butter, stick margarine, or lard.  Try baking, boiling, grilling, or broiling instead of frying.  Remove the fatty part of meats before cooking.  Steam vegetables in water or broth. Meal planning   At meals, imagine dividing your plate into fourths: ? One-half of your plate is fruits and vegetables. ? One-fourth of your plate is whole grains. ? One-fourth of your plate is protein, especially lean meats, poultry, eggs, tofu, beans, or nuts.  Include low-fat dairy as part of your daily diet. Lifestyle  Choose healthy options in all settings, including home, work, school, restaurants, or stores.  Prepare your food safely: ? Wash your hands after handling raw meats. ? Keep food preparation surfaces clean by regularly washing with hot, soapy water. ? Keep raw meats separate from ready-to-eat foods, such as fruits and vegetables. ? Cook seafood, meat, poultry, and eggs to the recommended internal temperature. ? Store foods at safe temperatures. In general:  Keep cold foods at 24F (4.4C) or below.  Keep hot foods at 124F (60C) or above.  Keep your freezer at [facility] (-17.8C) or below.  Foods are no longer safe to eat when they have been between the temperatures of 40-124F (4.4-60C)  for more than 2 hours. What foods should I eat? Fruits Aim to eat 2 cup-equivalents of fresh, canned (in natural juice), or frozen fruits each day. Examples of 1 cup-equivalent of fruit include 1 small apple, 8 large strawberries, 1 cup canned fruit,  cup dried fruit, or 1 cup  100% juice. Vegetables Aim to eat 2-3 cup-equivalents of fresh and frozen vegetables each day, including different varieties and colors. Examples of 1 cup-equivalent of vegetables include 2 medium carrots, 2 cups raw, leafy greens, 1 cup chopped vegetable (raw or cooked), or 1 medium baked potato. Grains Aim to eat 6 ounce-equivalents of whole grains each day. Examples of 1 ounce-equivalent of grains include 1 slice of bread, 1 cup ready-to-eat cereal, 3 cups popcorn, or  cup cooked rice, pasta, or cereal. Meats and other proteins Aim to eat 5-6 ounce-equivalents of protein each day. Examples of 1 ounce-equivalent of protein include 1 egg, 1/2 cup nuts or seeds, or 1 tablespoon (16 g) peanut butter. A cut of meat or fish that is the size of a deck of cards is about 3-4 ounce-equivalents.  Of the protein you eat each week, try to have at least 8 ounces come from seafood. This includes salmon, trout, herring, and anchovies. Dairy Aim to eat 3 cup-equivalents of fat-free or low-fat dairy each day. Examples of 1 cup-equivalent of dairy include 1 cup (240 mL) milk, 8 ounces (250 g) yogurt, 1 ounces (44 g) natural cheese, or 1 cup (240 mL) fortified soy milk. Fats and oils  Aim for about 5 teaspoons (21 g) per day. Choose monounsaturated fats, such as canola and olive oils, avocados, peanut butter, and most nuts, or polyunsaturated fats, such as sunflower, corn, and soybean oils, walnuts, pine nuts, sesame seeds, sunflower seeds, and flaxseed. Beverages  Aim for six 8-oz glasses of water per day. Limit coffee to three to five 8-oz cups per day.  Limit caffeinated beverages that have added calories, such as soda and energy drinks.  Limit alcohol intake to no more than 1 drink a day for nonpregnant women and 2 drinks a day for men. One drink equals 12 oz of beer (355 mL), 5 oz of wine (148 mL), or 1 oz of hard liquor (44 mL). Seasoning and other foods  Avoid adding excess amounts of salt to your  foods. Try flavoring foods with herbs and spices instead of salt.  Avoid adding sugar to foods.  Try using oil-based dressings, sauces, and spreads instead of solid fats. This information is based on general [place] nutrition guidelines. For more information, visit choosemyplate.gov. Exact amounts may vary based on your nutrition needs. Summary  A healthy eating plan may help you to maintain a healthy weight, reduce the risk of chronic diseases, and stay active throughout your life.  Plan your meals. Make sure you eat the right portions of a variety of nutrient-rich foods.  Try baking, boiling, grilling, or broiling instead of frying.  Choose healthy options in all settings, including home, work, school, restaurants, or stores. This information is not intended to replace advice given to you by your health care provider. Make sure you discuss any questions you have with your health care provider. Document Released: 12/11/2017 Document Revised: 12/11/2017 Document Reviewed: 12/11/2017 Elsevier Interactive Patient Education  2019 [employer] Inc.  

## 2019-01-14 NOTE — Progress Notes (Addendum)
Presents today for [employer] Visit  I connected with  Cory Thomas on 01/28/19 by a video enabled telemedicine application and verified that I am speaking with the correct person using two identifiers.   The patient expressed understanding and agreed to proceed.    Date of last exam:  01/09/2018  Interpreter used for this visit? No  Telemed two authenticators used to identify patient  Patient Care Team: Horald Pollen, MD as PCP - General (Internal Medicine)   Other items to address today:   Walks 2 miles every day Walks 6 miles 2 days a week. Is currently training to climb in [place] in October.  Referral for nutritionist put in Scheduled for physical  Immunizations discussed     Other Screening: Last screening for diabetes:  01/09/2018 Last lipid screening: 01/09/2018  ADVANCE DIRECTIVES: Discussed: yes On File:yes Materials Provided: no  Immunization status:  Immunization History  Administered Date(s) Administered  . Hepatitis A, Adult 12/06/2017  . Influenza-Unspecified 06/17/2015  . Pneumococcal Conjugate-13 09/10/2016  . Tdap 06/17/2015     Health Maintenance Due  Topic Date Due  . PNA vac Low Risk Adult (2 of 2 - PPSV23) 09/10/2017     Functional Status Survey: Is the patient deaf or have difficulty hearing?: No Does the patient have difficulty seeing, even when wearing glasses/contacts?: No Does the patient have difficulty concentrating, remembering, or making decisions?: No Does the patient have difficulty walking or climbing stairs?: No Does the patient have difficulty dressing or bathing?: No Does the patient have difficulty doing errands alone such as visiting a doctor's office or shopping?: No   6CIT Screen 01/14/2019  What Year? 0 points  What month? 0 points  What time? 0 points  Count back from 20 0 points  Months in reverse 0 points  Repeat phrase 0 points  Total Score 0       Clinical Support from 01/14/2019 in [facility]  AUDIT-C Score  3       Home Environment:    Lives in two story home  No trouble climbing stairs  No scattered rugs No grab bars Well lit   Patient Active Problem List   Diagnosis Date Noted  . Need for hepatitis A vaccination 12/06/2017     Past Medical History:  Diagnosis Date  . Allergy   . Anxiety   . Asthma    age 68  . Cancer ([place])    skin on arms and legs years ago rmoved over 10 yrs ago  . Gastritis    years ago  . Osteoporosis      Past Surgical History:  Procedure Laterality Date  . CLOSED REDUCTION WRIST FRACTURE     68 years old  . TONSILLECTOMY       Family History  Problem Relation Age of Onset  . Colon cancer Neg Hx   . Colon polyps Neg Hx   . Esophageal cancer Neg Hx   . Rectal cancer Neg Hx   . Stomach cancer Neg Hx      Social History   Socioeconomic History  . Marital status: Married    Spouse name: Not on file  . Number of children: Not on file  . Years of education: Not on file  . Highest education level: Not on file  Occupational History  . Occupation: Probation officer  Social Needs  . Financial resource strain: Not on file  . Food insecurity:  Worry: Not on file    Inability: Not on file  . Transportation needs:    Medical: Not on file    Non-medical: Not on file  Tobacco Use  . Smoking status: Never Smoker  . Smokeless tobacco: Never Used  Substance and Sexual Activity  . Alcohol use: Yes    Alcohol/week: 3.0 standard drinks    Types: 3 Glasses of wine per week    Comment: 1-2 bottles of beer/week  . Drug use: Never  . Sexual activity: Not on file  Lifestyle  . Physical activity:    Days per week: Not on file    Minutes per session: Not on file  . Stress: Not on file  Relationships  . Social connections:    Talks on phone: Not on file    Gets together: Not on file    Attends religious service: Not on file    Active member of club or  organization: Not on file    Attends meetings of clubs or organizations: Not on file    Relationship status: Not on file  . Intimate partner violence:    Fear of current or ex partner: Not on file    Emotionally abused: Not on file    Physically abused: Not on file    Forced sexual activity: Not on file  Other Topics Concern  . Not on file  Social History Narrative  . Not on file     No Known Allergies   Prior to Admission medications   Medication Sig Start Date End Date Taking? Authorizing Provider  Alpha-D-Galactosidase [facility]) TABS Take by mouth as needed.   Yes [provider]     Depression screen [facility] 2/9 01/14/2019 01/09/2018 12/06/2017  Decreased Interest 0 0 0  Down, Depressed, Hopeless 0 0 0  PHQ - 2 Score 0 0 0     Fall Risk  01/14/2019 01/09/2018 12/06/2017  Falls in the past year? 0 No No  Number falls in past yr: 0 - -  Injury with Fall? 0 - -      PHYSICAL EXAM: BP 98/60   Ht 5\' 10"  (1.778 m)   Wt 140 lb (63.5 kg)   BMI 20.09 kg/m    Wt Readings from Last 3 Encounters:  01/14/19 140 lb (63.5 kg)  03/29/18 140 lb (63.5 kg)  03/14/18 140 lb 9.6 oz (63.8 kg)     No exam data present    Physical Exam   Education/Counseling provided regarding diet and exercise, prevention of chronic diseases, smoking/tobacco cessation, if applicable, and reviewed "Covered Medicare Preventive Services."   ASSESSMENT/PLAN: There are no diagnoses linked to this encounter.   I have reviewed and agree with the above AWV documentation. Agustina Caroli, MD

## 2019-01-15 ENCOUNTER — Other Ambulatory Visit: Payer: Self-pay | Admitting: *Deleted

## 2019-04-22 ENCOUNTER — Encounter: Payer: PPO | Admitting: Emergency Medicine

## 2019-07-08 ENCOUNTER — Other Ambulatory Visit: Payer: Self-pay

## 2019-07-08 ENCOUNTER — Ambulatory Visit (INDEPENDENT_AMBULATORY_CARE_PROVIDER_SITE_OTHER): Payer: PPO

## 2019-07-08 DIAGNOSIS — Z23 Encounter for immunization: Secondary | ICD-10-CM | POA: Diagnosis not present

## 2019-11-05 ENCOUNTER — Other Ambulatory Visit: Payer: Self-pay

## 2019-11-05 ENCOUNTER — Encounter: Payer: Self-pay | Admitting: Emergency Medicine

## 2019-11-05 ENCOUNTER — Ambulatory Visit (INDEPENDENT_AMBULATORY_CARE_PROVIDER_SITE_OTHER): Payer: PPO | Admitting: Emergency Medicine

## 2019-11-05 VITALS — BP 116/72 | HR 71 | Temp 98.2°F | Resp 16 | Ht 70.0 in | Wt 139.0 lb

## 2019-11-05 DIAGNOSIS — Z130 Encounter for screening for diseases of the blood and blood-forming organs and certain disorders involving the immune mechanism: Secondary | ICD-10-CM | POA: Diagnosis not present

## 2019-11-05 DIAGNOSIS — Z1329 Encounter for screening for other suspected endocrine disorder: Secondary | ICD-10-CM | POA: Diagnosis not present

## 2019-11-05 DIAGNOSIS — Z13228 Encounter for screening for other metabolic disorders: Secondary | ICD-10-CM | POA: Diagnosis not present

## 2019-11-05 DIAGNOSIS — Z23 Encounter for immunization: Secondary | ICD-10-CM

## 2019-11-05 DIAGNOSIS — M6740 Ganglion, unspecified site: Secondary | ICD-10-CM

## 2019-11-05 DIAGNOSIS — Z0000 Encounter for general adult medical examination without abnormal findings: Secondary | ICD-10-CM

## 2019-11-05 DIAGNOSIS — Z13220 Encounter for screening for lipoid disorders: Secondary | ICD-10-CM | POA: Diagnosis not present

## 2019-11-05 DIAGNOSIS — K319 Disease of stomach and duodenum, unspecified: Secondary | ICD-10-CM

## 2019-11-05 DIAGNOSIS — Z0001 Encounter for general adult medical examination with abnormal findings: Secondary | ICD-10-CM

## 2019-11-05 NOTE — Progress Notes (Signed)
Cory Thomas 69 y.o.   Chief Complaint  Patient presents with  . Annual Exam    HISTORY OF PRESENT ILLNESS: This is a 69 y.o. male here for annual exam. Healthy male with a healthy lifestyle. Non-smoker and no EtOH user. No chronic medical problems.  On no chronic medication. Jehovah's Witness. Has 2 concerns: 1.  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  Food feels like it gets "stuck in my stomach". 2.  Small bump on right Achilles tendon. No other complaints or medical concerns today.  HPI   Prior to Admission medications   Medication Sig Start Date End Date Taking? Authorizing Provider  Alpha-D-Galactosidase [facility]) TABS Take by mouth as needed.   Yes [provider]  VITAMIN D, CHOLECALCIFEROL, PO Take by mouth.   Yes [provider]    No Known Allergies  There are no problems to display for this patient.   Past Medical History:  Diagnosis Date  . Allergy   . Anxiety   . Asthma    age 69  . Cancer ([place])    skin on arms and legs years ago rmoved over 10 yrs ago  . Gastritis    years ago  . Osteoporosis     Past Surgical History:  Procedure Laterality Date  . CLOSED REDUCTION WRIST FRACTURE     69 years old  . TONSILLECTOMY      Social History   Socioeconomic History  . Marital status: Married    Spouse name: Not on file  . Number of children: Not on file  . Years of education: Not on file  . Highest education level: Not on file  Occupational History  . Occupation: Probation officer  Tobacco Use  . Smoking status: Never Smoker  . Smokeless tobacco: Never Used  Substance and Sexual Activity  . Alcohol use: Yes    Alcohol/week: 3.0 standard drinks    Types: 3 Glasses of wine per week    Comment: 1-2 bottles of beer/week  . Drug use: Never  . Sexual activity: Not  on file  Other Topics Concern  . Not on file  Social History Narrative  . Not on file   Social Determinants of Health   Financial Resource Strain:   . Difficulty of Paying Living Expenses: Not on file  Food Insecurity:   . Worried About Charity fundraiser in the Last Year: Not on file  . Ran Out of Food in the Last Year: Not on file  Transportation Needs:   . Lack of Transportation (Medical): Not on file  . Lack of Transportation (Non-Medical): Not on file  Physical Activity:   . Days of Exercise per Week: Not on file  . Minutes of Exercise per Session: Not on file  Stress:   . Feeling of Stress : Not on file  Social Connections:   . Frequency of Communication with Friends and Family: Not on file  . Frequency of Social Gatherings with Friends and Family: Not on file  . Attends Religious Services: Not on file  . Active Member of Clubs or Organizations: Not on file  . Attends Archivist Meetings: Not on file  . Marital Status: Not on file  Intimate Partner Violence:   . Fear of Current or Ex-Partner: Not on file  . Emotionally Abused: Not on file  .  Physically Abused: Not on file  . Sexually Abused: Not on file    Family History  Problem Relation Age of Onset  . Colon cancer Neg Hx   . Colon polyps Neg Hx   . Esophageal cancer Neg Hx   . Rectal cancer Neg Hx   . Stomach cancer Neg Hx      Review of Systems  Constitutional: Negative.  Negative for chills and fever.  HENT: Negative.  Negative for congestion and sore throat.   Respiratory: Negative.  Negative for cough and shortness of breath.   Cardiovascular: Negative.  Negative for chest pain and palpitations.  Gastrointestinal: Negative.  Negative for abdominal pain, blood in stool, diarrhea, melena, nausea and vomiting.       Early satiety and slow food movement through the stomach  Genitourinary: Negative.  Negative for dysuria and hematuria.  Musculoskeletal: Negative.  Negative for back pain, joint  pain, myalgias and neck pain.  Skin: Negative.  Negative for rash.  Neurological: Negative.  Negative for dizziness and headaches.  Endo/Heme/Allergies: Negative.   All other systems reviewed and are negative.  There were no vitals filed for this visit.   Physical Exam Vitals reviewed.  Constitutional:      Appearance: Normal appearance.  HENT:     Head: Normocephalic.  Eyes:     Extraocular Movements: Extraocular movements intact.     Conjunctiva/sclera: Conjunctivae normal.     Pupils: Pupils are equal, round, and reactive to light.  Cardiovascular:     Rate and Rhythm: Normal rate and regular rhythm.     Pulses: Normal pulses.     Heart sounds: Normal heart sounds.  Pulmonary:     Effort: Pulmonary effort is normal.     Breath sounds: Normal breath sounds.  Abdominal:     General: Bowel sounds are normal. There is no distension.     Palpations: Abdomen is soft. There is no mass.     Tenderness: There is no abdominal tenderness. There is no right CVA tenderness or left CVA tenderness.  Musculoskeletal:        General: Normal range of motion.     Cervical back: Normal range of motion and neck supple.  Lymphadenopathy:     Cervical: No cervical adenopathy.     Upper Body:     Right upper body: No supraclavicular or axillary adenopathy.     Left upper body: No supraclavicular or axillary adenopathy.     Lower Body: No right inguinal adenopathy. No left inguinal adenopathy.  Skin:    General: Skin is warm and dry.     Capillary Refill: Capillary refill takes less than 2 seconds.     Comments: Small ganglionic cyst on right distal Achilles tendon  Neurological:     General: No focal deficit present.     Mental Status: He is alert and oriented to person, place, and time.     Sensory: No sensory deficit.     Motor: No weakness.     Coordination: Coordination normal.  Psychiatric:        Mood and Affect: Mood normal.        Behavior: Behavior normal.      ASSESSMENT  & PLAN: Cory Thomas was seen today for annual exam.  Diagnoses and all orders for this visit:  Routine general medical examination at a health care facility  Need for prophylactic vaccination against Streptococcus pneumoniae (pneumococcus) -     Cancel: Pneumococcal polysaccharide vaccine 23-valent greater than or equal to  2yo subcutaneous/IM  Screening for deficiency anemia -     CBC with Differential/Platelet; Future  Screening for lipoid disorders -     Lipid panel; Future  Screening for endocrine, metabolic and immunity disorder -     Comprehensive metabolic panel; Future -     Hemoglobin A1c; Future  Stomach problems -     Ambulatory referral to Gastroenterology  Ganglion cyst Comments: Right Achilles tendon    Patient Instructions       If you have lab work done today you will be contacted with your lab results within the next 2 weeks.  If you have not heard from [place] then please contact us. The fastest way to get your results is to register for My Chart.   IF you received an x-ray today, you will receive an invoice from [facility]. Please contact [facility] at [phone] with questions or concerns regarding your invoice.   IF you received labwork today, you will receive an invoice from [facility]. Please contact [facility] at [phone] with questions or concerns regarding your invoice.   Our billing staff will not be able to assist you with questions regarding bills from these companies.  You will be contacted with the lab results as soon as they are available. The fastest way to get your results is to activate your My Chart account. Instructions are located on the last page of this paperwork. If you have not heard from [place] regarding the results in 2 weeks, please contact this office.     Health Maintenance After Age 30 After age 57, you are at a higher risk for certain long-term diseases and infections as well as injuries from falls. Falls are a  major cause of broken bones and head injuries in people who are older than age 59. Getting regular preventive care can help to keep you healthy and well. Preventive care includes getting regular testing and making lifestyle changes as recommended by your health care provider. Talk with your health care provider about:  Which screenings and tests you should have. A screening is a test that checks for a disease when you have no symptoms.  A diet and exercise plan that is right for you. What should I know about screenings and tests to prevent falls? Screening and testing are the best ways to find a health problem early. Early diagnosis and treatment give you the best chance of managing medical conditions that are common after age 66. Certain conditions and lifestyle choices may make you more likely to have a fall. Your health care provider may recommend:  Regular vision checks. Poor vision and conditions such as cataracts can make you more likely to have a fall. If you wear glasses, make sure to get your prescription updated if your vision changes.  Medicine review. Work with your health care provider to regularly review all of the medicines you are taking, including over-the-counter medicines. Ask your health care provider about any side effects that may make you more likely to have a fall. Tell your health care provider if any medicines that you take make you feel dizzy or sleepy.  Osteoporosis screening. Osteoporosis is a condition that causes the bones to get weaker. This can make the bones weak and cause them to break more easily.  Blood pressure screening. Blood pressure changes and medicines to control blood pressure can make you feel dizzy.  Strength and balance checks. Your health care provider may recommend certain tests to check your strength and balance while  standing, walking, or changing positions.  Foot health exam. Foot pain and numbness, as well as not wearing proper footwear, can  make you more likely to have a fall.  Depression screening. You may be more likely to have a fall if you have a fear of falling, feel emotionally low, or feel unable to do activities that you used to do.  Alcohol use screening. Using too much alcohol can affect your balance and may make you more likely to have a fall. What actions can I take to lower my risk of falls? General instructions  Talk with your health care provider about your risks for falling. Tell your health care provider if: ? You fall. Be sure to tell your health care provider about all falls, even ones that seem minor. ? You feel dizzy, sleepy, or off-balance.  Take over-the-counter and prescription medicines only as told by your health care provider. These include any supplements.  Eat a healthy diet and maintain a healthy weight. A healthy diet includes low-fat dairy products, low-fat (lean) meats, and fiber from whole grains, beans, and lots of fruits and vegetables. Home safety  Remove any tripping hazards, such as rugs, cords, and clutter.  Install safety equipment such as grab bars in bathrooms and safety rails on stairs.  Keep rooms and walkways well-lit. Activity   Follow a regular exercise program to stay fit. This will help you maintain your balance. Ask your health care provider what types of exercise are appropriate for you.  If you need a cane or walker, use it as recommended by your health care provider.  Wear supportive shoes that have nonskid soles. Lifestyle  Do not drink alcohol if your health care provider tells you not to drink.  If you drink alcohol, limit how much you have: ? 0-1 drink a day for women. ? 0-2 drinks a day for men.  Be aware of how much alcohol is in your drink. In the [place] one drink equals one typical bottle of beer (12 oz), one-half glass of wine (5 oz), or one shot of hard liquor (1 oz).  Do not use any products that contain nicotine or tobacco, such as cigarettes and  e-cigarettes. If you need help quitting, ask your health care provider. Summary  Having a healthy lifestyle and getting preventive care can help to protect your health and wellness after age 58.  Screening and testing are the best way to find a health problem early and help you avoid having a fall. Early diagnosis and treatment give you the best chance for managing medical conditions that are more common for people who are older than age 28.  Falls are a major cause of broken bones and head injuries in people who are older than age 90. Take precautions to prevent a fall at home.  Work with your health care provider to learn what changes you can make to improve your health and wellness and to prevent falls. This information is not intended to replace advice given to you by your health care provider. Make sure you discuss any questions you have with your health care provider. Document Revised: 12/20/2018 Document Reviewed: 07/12/2017 Elsevier Patient Education  2020 Elsevier Inc.      Agustina Caroli, MD [facility]

## 2019-11-05 NOTE — Patient Instructions (Addendum)
   If you have lab work done today you will be contacted with your lab results within the next 2 weeks.  If you have not heard from us then please contact us. The fastest way to get your results is to register for My Chart.   IF you received an x-ray today, you will receive an invoice from [facility]. Please contact [facility] at [phone] with questions or concerns regarding your invoice.   IF you received labwork today, you will receive an invoice from [facility]. Please contact [facility] at [phone] with questions or concerns regarding your invoice.   Our billing staff will not be able to assist you with questions regarding bills from these companies.  You will be contacted with the lab results as soon as they are available. The fastest way to get your results is to activate your My Chart account. Instructions are located on the last page of this paperwork. If you have not heard from us regarding the results in 2 weeks, please contact this office.     Health Maintenance After Age 69 After age 69, you are at a higher risk for certain long-term diseases and infections as well as injuries from falls. Falls are a major cause of broken bones and head injuries in people who are older than age 69. Getting regular preventive care can help to keep you healthy and well. Preventive care includes getting regular testing and making lifestyle changes as recommended by your health care provider. Talk with your health care provider about:  Which screenings and tests you should have. A screening is a test that checks for a disease when you have no symptoms.  A diet and exercise plan that is right for you. What should I know about screenings and tests to prevent falls? Screening and testing are the best ways to find a health problem early. Early diagnosis and treatment give you the best chance of managing medical conditions that are common after age 69. Certain conditions and  lifestyle choices may make you more likely to have a fall. Your health care provider may recommend:  Regular vision checks. Poor vision and conditions such as cataracts can make you more likely to have a fall. If you wear glasses, make sure to get your prescription updated if your vision changes.  Medicine review. Work with your health care provider to regularly review all of the medicines you are taking, including over-the-counter medicines. Ask your health care provider about any side effects that may make you more likely to have a fall. Tell your health care provider if any medicines that you take make you feel dizzy or sleepy.  Osteoporosis screening. Osteoporosis is a condition that causes the bones to get weaker. This can make the bones weak and cause them to break more easily.  Blood pressure screening. Blood pressure changes and medicines to control blood pressure can make you feel dizzy.  Strength and balance checks. Your health care provider may recommend certain tests to check your strength and balance while standing, walking, or changing positions.  Foot health exam. Foot pain and numbness, as well as not wearing proper footwear, can make you more likely to have a fall.  Depression screening. You may be more likely to have a fall if you have a fear of falling, feel emotionally low, or feel unable to do activities that you used to do.  Alcohol use screening. Using too much alcohol can affect your balance and may make you more likely to   have a fall. What actions can I take to lower my risk of falls? General instructions  Talk with your health care provider about your risks for falling. Tell your health care provider if: ? You fall. Be sure to tell your health care provider about all falls, even ones that seem minor. ? You feel dizzy, sleepy, or off-balance.  Take over-the-counter and prescription medicines only as told by your health care provider. These include any  supplements.  Eat a healthy diet and maintain a healthy weight. A healthy diet includes low-fat dairy products, low-fat (lean) meats, and fiber from whole grains, beans, and lots of fruits and vegetables. Home safety  Remove any tripping hazards, such as rugs, cords, and clutter.  Install safety equipment such as grab bars in bathrooms and safety rails on stairs.  Keep rooms and walkways well-lit. Activity   Follow a regular exercise program to stay fit. This will help you maintain your balance. Ask your health care provider what types of exercise are appropriate for you.  If you need a cane or walker, use it as recommended by your health care provider.  Wear supportive shoes that have nonskid soles. Lifestyle  Do not drink alcohol if your health care provider tells you not to drink.  If you drink alcohol, limit how much you have: ? 0-1 drink a day for women. ? 0-2 drinks a day for men.  Be aware of how much alcohol is in your drink. In the [place] one drink equals one typical bottle of beer (12 oz), one-half glass of wine (5 oz), or one shot of hard liquor (1 oz).  Do not use any products that contain nicotine or tobacco, such as cigarettes and e-cigarettes. If you need help quitting, ask your health care provider. Summary  Having a healthy lifestyle and getting preventive care can help to protect your health and wellness after age 69.  Screening and testing are the best way to find a health problem early and help you avoid having a fall. Early diagnosis and treatment give you the best chance for managing medical conditions that are more common for people who are older than age 69.  Falls are a major cause of broken bones and head injuries in people who are older than age 69. Take precautions to prevent a fall at home.  Work with your health care provider to learn what changes you can make to improve your health and wellness and to prevent falls. This information is not intended  to replace advice given to you by your health care provider. Make sure you discuss any questions you have with your health care provider. Document Revised: 12/20/2018 Document Reviewed: 07/12/2017 Elsevier Patient Education  2020 [employer]vier Inc.  

## 2019-11-06 ENCOUNTER — Ambulatory Visit (INDEPENDENT_AMBULATORY_CARE_PROVIDER_SITE_OTHER): Payer: PPO | Admitting: Emergency Medicine

## 2019-11-06 ENCOUNTER — Other Ambulatory Visit: Payer: Self-pay

## 2019-11-06 DIAGNOSIS — Z13220 Encounter for screening for lipoid disorders: Secondary | ICD-10-CM | POA: Diagnosis not present

## 2019-11-06 DIAGNOSIS — Z1329 Encounter for screening for other suspected endocrine disorder: Secondary | ICD-10-CM

## 2019-11-06 DIAGNOSIS — Z130 Encounter for screening for diseases of the blood and blood-forming organs and certain disorders involving the immune mechanism: Secondary | ICD-10-CM

## 2019-11-06 DIAGNOSIS — Z13228 Encounter for screening for other metabolic disorders: Secondary | ICD-10-CM | POA: Diagnosis not present

## 2019-11-07 ENCOUNTER — Encounter: Payer: Self-pay | Admitting: Emergency Medicine

## 2019-11-07 LAB — CBC WITH DIFFERENTIAL/PLATELET
Basophils Absolute: 0.1 10*3/uL (ref 0.0–0.2)
Basos: 2 %
EOS (ABSOLUTE): 0.2 10*3/uL (ref 0.0–0.4)
Eos: 6 %
Hematocrit: 49.9 % (ref 37.5–51.0)
Hemoglobin: 16.6 g/dL (ref 13.0–17.7)
Immature Grans (Abs): 0 10*3/uL (ref 0.0–0.1)
Immature Granulocytes: 1 %
Lymphocytes Absolute: 1.2 10*3/uL (ref 0.7–3.1)
Lymphs: 29 %
MCH: 31 pg (ref 26.6–33.0)
MCHC: 33.3 g/dL (ref 31.5–35.7)
MCV: 93 fL (ref 79–97)
Monocytes Absolute: 0.4 10*3/uL (ref 0.1–0.9)
Monocytes: 9 %
Neutrophils Absolute: 2.3 10*3/uL (ref 1.4–7.0)
Neutrophils: 53 %
Platelets: 183 10*3/uL (ref 150–450)
RBC: 5.36 x10E6/uL (ref 4.14–5.80)
RDW: 12.8 % (ref 11.6–15.4)
WBC: 4.2 10*3/uL (ref 3.4–10.8)

## 2019-11-07 LAB — COMPREHENSIVE METABOLIC PANEL
ALT: 21 IU/L (ref 0–44)
AST: 31 IU/L (ref 0–40)
Albumin/Globulin Ratio: 1.8 (ref 1.2–2.2)
Albumin: 4.1 g/dL (ref 3.8–4.8)
Alkaline Phosphatase: 94 IU/L (ref 39–117)
BUN/Creatinine Ratio: 23 (ref 10–24)
BUN: 19 mg/dL (ref 8–27)
Bilirubin Total: 0.5 mg/dL (ref 0.0–1.2)
CO2: 23 mmol/L (ref 20–29)
Calcium: 8.9 mg/dL (ref 8.6–10.2)
Chloride: 104 mmol/L (ref 96–106)
Creatinine, Ser: 0.84 mg/dL (ref 0.76–1.27)
GFR calc Af Amer: 104 mL/min/{1.73_m2} (ref >59)
GFR calc non Af Amer: 90 mL/min/{1.73_m2} (ref >59)
Globulin, Total: 2.3 g/dL (ref 1.5–4.5)
Glucose: 97 mg/dL (ref 65–99)
Potassium: 4.7 mmol/L (ref 3.5–5.2)
Sodium: 141 mmol/L (ref 134–144)
Total Protein: 6.4 g/dL (ref 6.0–8.5)

## 2019-11-07 LAB — HEMOGLOBIN A1C
Est. average glucose Bld gHb Est-mCnc: 120 mg/dL
Hgb A1c MFr Bld: 5.8 % — ABNORMAL HIGH (ref 4.8–5.6)

## 2019-11-07 LAB — LIPID PANEL
Chol/HDL Ratio: 2.3 ratio (ref 0.0–5.0)
Cholesterol, Total: 137 mg/dL (ref 100–199)
HDL: 60 mg/dL (ref >39)
LDL Chol Calc (NIH): 66 mg/dL (ref 0–99)
Triglycerides: 49 mg/dL (ref 0–149)
VLDL Cholesterol Cal: 11 mg/dL (ref 5–40)

## 2019-11-11 ENCOUNTER — Encounter: Payer: Self-pay | Admitting: Emergency Medicine

## 2020-01-07 ENCOUNTER — Telehealth: Payer: Self-pay | Admitting: *Deleted

## 2020-01-07 NOTE — Telephone Encounter (Signed)
Schedule AWV.  

## 2020-01-17 ENCOUNTER — Ambulatory Visit: Payer: PPO | Admitting: Gastroenterology

## 2020-01-23 ENCOUNTER — Ambulatory Visit: Payer: PPO | Admitting: Gastroenterology

## 2020-01-28 ENCOUNTER — Ambulatory Visit: Payer: PPO | Admitting: Gastroenterology

## 2020-01-28 ENCOUNTER — Encounter: Payer: Self-pay | Admitting: Gastroenterology

## 2020-01-28 VITALS — BP 110/70 | HR 80 | Ht 70.0 in | Wt 139.5 lb

## 2020-01-28 DIAGNOSIS — R112 Nausea with vomiting, unspecified: Secondary | ICD-10-CM | POA: Diagnosis not present

## 2020-01-28 DIAGNOSIS — R1013 Epigastric pain: Secondary | ICD-10-CM | POA: Diagnosis not present

## 2020-01-28 DIAGNOSIS — R1310 Dysphagia, unspecified: Secondary | ICD-10-CM

## 2020-01-28 NOTE — Progress Notes (Signed)
HPI :  69 year old male known to me from prior colonoscopy in 2019, here for a follow-up visit for abdominal pain.  He states he has had epigastric pain intermittently for several years.  He describes this as " pyloric sphincter" related pain.  He states at times after he eats he has discomfort in his upper abdomen, usually within 2 to 3 hours after eating with an extreme fullness in his stomach.  He needs to vomit to relieve the discomfort and then he feels fine.  He states this happens about once per month at baseline if he does not watch his diet.  He is found that if he chews his food quite well, he avoid eating too much, and he avoids red meat, he will not have symptoms.  He eats about 5-6 small meals per day to avoid causing the symptoms.  He has occasional alcohol use, not daily.  He has nausea when he has the symptoms but otherwise denies pain outside of these episodes.  He states his weight has been stable over years.  He denies any NSAID use at all.  No reflux symptoms that bothers him.  He does have longstanding intermittent solid food dysphagia, particularly to rice, he will feel things get stuck in his lower esophagus.  He states multiple family members have had similar issues.  Denies family history of esophageal, gastric, or colon cancer.  If he chews his food well and eat slowly he typically will not have any swallowing difficulty.  He does take Beano that helps with gas production.  He eats extremely healthy foods, no processed foods in his diet at all, he brings a list of foods that he typically will eat.  He is never had a prior bowel obstruction.  He denies any prior surgeries to his abdomen.  He is up-to-date with his Covid vaccine.  He has never had a prior upper endoscopy.  He had one small adenoma removed on his colonoscopy in 2019.  He is otherwise very healthy and active, plans on going on a trip to [place] soon for hiking in the woods   Colonoscopy 03/29/18 - The perianal and  digital rectal examinations were normal. - A 3 to 4 mm polyp was found in the transverse colon. The polyp was sessile. The polyp was removed with a cold snare. Resection and retrieval were complete. - A few small-mouthed diverticula were found in the sigmoid colon. - The colon was tortuous. - Internal hemorrhoids were found during retroflexion. The hemorrhoids were small. - The exam was otherwise without abnormality.  Path c/w adenoma  Past Medical History:  Diagnosis Date  . Allergy   . Anxiety    per pt after father died  . Asthma    age 65  . Cancer ([place])    skin on arms and legs years ago rmoved over 10 yrs ago  . Gastritis    years ago  . Osteoporosis      Past Surgical History:  Procedure Laterality Date  . CLOSED REDUCTION WRIST FRACTURE     69 years old  . POLYPECTOMY  12/2018   colon  . TONSILLECTOMY     Family History  Problem Relation Age of Onset  . Dementia Father   . Colon cancer Neg Hx   . Colon polyps Neg Hx   . Esophageal cancer Neg Hx   . Rectal cancer Neg Hx   . Stomach cancer Neg Hx    Social History   Tobacco Use  .  Smoking status: Never Smoker  . Smokeless tobacco: Never Used  Substance Use Topics  . Alcohol use: Yes    Alcohol/week: 3.0 standard drinks    Types: 3 Glasses of wine per week    Comment: 1-2 bottles of beer/week, red wine  . Drug use: Never   Current Outpatient Medications  Medication Sig Dispense Refill  . Alpha-D-Galactosidase (BEANO) TABS Take by mouth as needed.    Marland Kitchen VITAMIN D, CHOLECALCIFEROL, PO Take by mouth.     Current Facility-Administered Medications  Medication Dose Route Frequency Provider Last Rate Last Admin  . 0.9 %  sodium chloride infusion  500 mL Intravenous Once Cory Thomas, [name], MD       No Known Allergies   Review of Systems: All systems reviewed and negative except where noted in HPI.   Lab Results  Component Value Date   WBC 4.2 11/06/2019   HGB 16.6 11/06/2019   HCT 49.9  11/06/2019   MCV 93 11/06/2019   PLT 183 11/06/2019   Lab Results  Component Value Date   CREATININE 0.84 11/06/2019   BUN 19 11/06/2019   NA 141 11/06/2019   K 4.7 11/06/2019   CL 104 11/06/2019   CO2 23 11/06/2019    Lab Results  Component Value Date   ALT 21 11/06/2019   AST 31 11/06/2019   ALKPHOS 94 11/06/2019   BILITOT 0.5 11/06/2019     Physical Exam: BP 110/70 (BP Location: Left Arm, Patient Position: Sitting, Cuff Size: Normal)   Pulse 80   Ht 5\' 10"  (1.778 m) Comment: height measured without shoes  Wt 139 lb 8 oz (63.3 kg)   BMI 20.02 kg/m  Constitutional: Pleasant,well-developed, male in no acute distress. Abdominal: Soft, nondistended, nontender. There are no masses palpable.  Extremities: no edema Lymphadenopathy: No cervical adenopathy noted. Neurological: Alert and oriented to person place and time. Skin: Skin is warm and dry. No rashes noted. Psychiatric: Normal mood and affect. Behavior is normal.   ASSESSMENT AND PLAN: 69 year old male here for reassessment of the following:  Postprandial epigastric pain / vomiting - as above, longstanding history of intermittent postprandial nausea and vomiting as outlined above.  With strict dietary changes as outlined above he has minimized the symptoms however he can have symptoms if he does not follow this diet.  I discussed differential diagnosis with him to include partial gastric outlet obstruction, gastric mass lesion, etc., functional gastric outlet disorder, while proximal partial small bowel obstruction also possible.  Discussed options with him.  I am recommending an upper endoscopy to further evaluate this issue initially as well as to address the dysphagia as outlined below.  I discussed risks and benefits of endoscopy and anesthesia with him and he wanted to proceed.  Further recommendations pending the result  Dysphagia - intermittent solid food dysphagia that is stable over years and controlled with  dietary measures as above.  We will evaluate this with his upper endoscopy as outlined above. Discussed ddx for this. If we find a Schatzki ring or stricture, we will plan on dilation.  Discussed risk and benefits and he wants to proceed  Moore Haven Cellar, MD [facility]

## 2020-01-28 NOTE — Patient Instructions (Signed)
If you are age 69 or older, your body mass index should be between 23-30. Your Body mass index is 20.02 kg/m. If this is out of the aforementioned range listed, please consider follow up with your Primary Care Provider.  If you are age 45 or younger, your body mass index should be between 19-25. Your Body mass index is 20.02 kg/m. If this is out of the aformentioned range listed, please consider follow up with your Primary Care Provider.   You have been scheduled for an endoscopy. Please follow written instructions given to you at your visit today. If you use inhalers (even only as needed), please bring them with you on the day of your procedure.    Thank you for entrusting me with your care and for choosing [facility], Dr. Rosemead Cellar

## 2020-01-29 ENCOUNTER — Ambulatory Visit (AMBULATORY_SURGERY_CENTER): Payer: PPO | Admitting: Gastroenterology

## 2020-01-29 ENCOUNTER — Other Ambulatory Visit: Payer: Self-pay

## 2020-01-29 ENCOUNTER — Telehealth: Payer: Self-pay

## 2020-01-29 ENCOUNTER — Encounter: Payer: Self-pay | Admitting: Gastroenterology

## 2020-01-29 VITALS — BP 91/72 | HR 57 | Temp 96.6°F | Resp 12 | Ht 70.0 in | Wt 139.0 lb

## 2020-01-29 DIAGNOSIS — K3189 Other diseases of stomach and duodenum: Secondary | ICD-10-CM | POA: Diagnosis not present

## 2020-01-29 DIAGNOSIS — K222 Esophageal obstruction: Secondary | ICD-10-CM | POA: Diagnosis not present

## 2020-01-29 DIAGNOSIS — R1013 Epigastric pain: Secondary | ICD-10-CM | POA: Diagnosis not present

## 2020-01-29 DIAGNOSIS — R1310 Dysphagia, unspecified: Secondary | ICD-10-CM

## 2020-01-29 DIAGNOSIS — K2950 Unspecified chronic gastritis without bleeding: Secondary | ICD-10-CM

## 2020-01-29 MED ORDER — SODIUM CHLORIDE 0.9 % IV SOLN: 500.0000 mL | INTRAVENOUS | Status: DC

## 2020-01-29 NOTE — Patient Instructions (Signed)
Thank you for allowing [place] to care for you today!  Await pathology results, approximately 7-10 days.  Will make recommendations, if any, at that time.  Resume mediations today.  Return to your normal activities tomorrow.  Follow a post-dilation diet today.  See handout.  Recommend CT scan of abdomen and pelvis with contrast to further evaluate gastric lesion and rule out other causes of symptoms.  Our office will coordinate.     YOU HAD AN ENDOSCOPIC PROCEDURE TODAY AT [facility]:   Refer to the procedure report that was given to you for any specific questions about what was found during the examination.  If the procedure report does not answer your questions, please call your gastroenterologist to clarify.  If you requested that your care partner not be given the details of your procedure findings, then the procedure report has been included in a sealed envelope for you to review at your convenience later.  YOU SHOULD EXPECT: Some feelings of bloating in the abdomen. Passage of more gas than usual.  Walking can help get rid of the air that was put into your GI tract during the procedure and reduce the bloating. If you had a lower endoscopy (such as a colonoscopy or flexible sigmoidoscopy) you may notice spotting of blood in your stool or on the toilet paper. If you underwent a bowel prep for your procedure, you may not have a normal bowel movement for a few days.  Please Note:  You might notice some irritation and congestion in your nose or some drainage.  This is from the oxygen used during your procedure.  There is no need for concern and it should clear up in a day or so.  SYMPTOMS TO REPORT IMMEDIATELY:    Following upper endoscopy (EGD)  Vomiting of blood or coffee ground material  New chest pain or pain under the shoulder blades  Painful or persistently difficult swallowing  New shortness of breath  Fever of 100F or higher  Black, tarry-looking stools  For  urgent or emergent issues, a gastroenterologist can be reached at any hour by calling [phone]. Do not use MyChart messaging for urgent concerns.    DIET:  We do recommend a small meal at first, but then you may proceed to your regular diet.  Drink plenty of fluids but you should avoid alcoholic beverages for 24 hours.  ACTIVITY:  You should plan to take it easy for the rest of today and you should NOT DRIVE or use heavy machinery until tomorrow (because of the sedation medicines used during the test).    FOLLOW UP: Our staff will call the number listed on your records 48-72 hours following your procedure to check on you and address any questions or concerns that you may have regarding the information given to you following your procedure. If we do not reach you, we will leave a message.  We will attempt to reach you two times.  During this call, we will ask if you have developed any symptoms of COVID 19. If you develop any symptoms (ie: fever, flu-like symptoms, shortness of breath, cough etc.) before then, please call [phone].  If you test positive for Covid 19 in the 2 weeks post procedure, please call and report this information to [place].    If any biopsies were taken you will be contacted by phone or by letter within the next 1-3 weeks.  Please call us at [phone] if you have not heard about the  biopsies in 3 weeks.    SIGNATURES/CONFIDENTIALITY: You and/or your care partner have signed paperwork which will be entered into your electronic medical record.  These signatures attest to the fact that that the information above on your After Visit Summary has been reviewed and is understood.  Full responsibility of the confidentiality of this discharge information lies with you and/or your care-partner.

## 2020-01-29 NOTE — Progress Notes (Signed)
Called to room to assist during endoscopic procedure.  Patient ID and intended procedure confirmed with present staff. Received instructions for my participation in the procedure from the performing physician.  

## 2020-01-29 NOTE — Telephone Encounter (Signed)
-----   Message from [name], MD sent at 01/29/2020 12:24 PM EDT ----- Regarding: CT scan Hi Cory Thomas, This patient had an abnormal EGD with subepithelial gastric mass. He needs a CT abdomen / pelvis with contrast to further evaluate this and abdominal pain if you can help coordinate. Thanks!

## 2020-01-29 NOTE — Progress Notes (Signed)
pt tolerated well. VSS. awake and to recovery. Report given to RN.  

## 2020-01-29 NOTE — Op Note (Signed)
[place] Patient Name: Cory Thomas Procedure Date: 01/29/2020 9:05 AM MRN: [id number] Endoscopist: Remo Lipps P. Havery Moros , MD Age: 69 Referring MD:  Date of Birth: 01/12/1951 Gender: Male Account #: [id number] Procedure:                Upper GI endoscopy Indications:              Epigastric abdominal pain, Dysphagia, Nausea with                            vomiting Medicines:                Monitored Anesthesia Care Procedure:                Pre-Anesthesia Assessment:                           - Prior to the procedure, a History and Physical                            was performed, and patient medications and                            allergies were reviewed. The patient's tolerance of                            previous anesthesia was also reviewed. The risks                            and benefits of the procedure and the sedation                            options and risks were discussed with the patient.                            All questions were answered, and informed consent                            was obtained. Prior Anticoagulants: The patient has                            taken no previous anticoagulant or antiplatelet                            agents. ASA Grade Assessment: II - A patient with                            mild systemic disease. After reviewing the risks                            and benefits, the patient was deemed in                            satisfactory condition to undergo the procedure.  After obtaining informed consent, the endoscope was                            passed under direct vision. Throughout the                            procedure, the patient's blood pressure, pulse, and                            oxygen saturations were monitored continuously. The                            Endoscope was introduced through the mouth, and                            advanced to the second part of duodenum.  The upper                            GI endoscopy was accomplished without difficulty.                            The patient tolerated the procedure well. Scope In: Scope Out: Findings:                 Esophagogastric landmarks were identified: the                            Z-line was found at 44 cm, the gastroesophageal                            junction was found at 44 cm and the upper extent of                            the gastric folds was found at 44 cm from the                            incisors.                           One benign-appearing, intrinsic mild subtle                            stenosis was found 44 cm from the incisors. This                            stenosis measured less than one cm (in length). A                            TTS dilator was passed through the scope. Dilation                            with a 16-17-18 mm balloon dilator was performed to  16 mm, 17 mm and then 18 mm after which an                            appropriate mucosal wrent was noted.                           The exam of the esophagus was otherwise normal.                           A single subepithelial mass / nodule, a few cms in                            size, perhaps 4 or so, was found on the greater                            curvature of the distal gastric body. It was hard                            to palpation with the forceps. Tunneled bite on                            bite biopsies were taken with a cold forceps for                            histology.                           The exam of the stomach was otherwise normal.                           Biopsies were taken with a cold forceps in the                            gastric body, at the incisura and in the gastric                            antrum for Helicobacter pylori testing.                           The duodenal bulb and second portion of the                            duodenum were  normal. Complications:            No immediate complications. Estimated blood loss:                            Minimal. Estimated Blood Loss:     Estimated blood loss was minimal. Impression:               - Esophagogastric landmarks identified.                           - Benign-appearing esophageal stenosis. Dilated to  19mm with good result.                           - A single subepthelial lesion found in the stomach                            as described. Biopsied.                           - Normal stomach otherwise - pylorus widely patent                            - biopsies taken to rule out H pylori                           - Normal duodenal bulb and second portion of the                            duodenum. Recommendation:           - Patient has a contact number available for                            emergencies. The signs and symptoms of potential                            delayed complications were discussed with the                            patient. Return to normal activities tomorrow.                            Written discharge instructions were provided to the                            patient.                           - Post dilation diet                           - Continue present medications.                           - Await pathology results.                           - Recommend CT scan abdomen / pelvis with contrast                            to further evaluate gastric lesion and rule out                            other causes of symptoms. Our office will coordinate Cory Thomas. Cory Fitzsimmons, MD 01/29/2020 9:36:01 AM This report has been signed electronically.

## 2020-01-29 NOTE — Telephone Encounter (Signed)
Patient notified of the CT scan scheduled for 02/04/20 .  He is asked to be NPO after 12:00 noon and to arrive at 3:15 for 4:00.  He will drink contrast there on site.  He will come for labs prior to the appt

## 2020-01-29 NOTE — Progress Notes (Signed)
Temp JB V/s CW  

## 2020-01-31 ENCOUNTER — Other Ambulatory Visit (INDEPENDENT_AMBULATORY_CARE_PROVIDER_SITE_OTHER): Payer: PPO

## 2020-01-31 ENCOUNTER — Telehealth: Payer: Self-pay | Admitting: *Deleted

## 2020-01-31 DIAGNOSIS — K3189 Other diseases of stomach and duodenum: Secondary | ICD-10-CM | POA: Diagnosis not present

## 2020-01-31 LAB — BUN: BUN: 33 mg/dL — ABNORMAL HIGH (ref 6–23)

## 2020-01-31 LAB — CREATININE, SERUM: Creatinine, Ser: 0.98 mg/dL (ref 0.40–1.50)

## 2020-01-31 NOTE — Telephone Encounter (Signed)
  Follow up Call-  Call back number 01/29/2020 03/29/2018  Post procedure Call Back phone  # [phone]  Permission to leave phone message Yes -  Some recent data might be hidden     Patient questions:  Do you have a fever, pain , or abdominal swelling? No. Pain Score  0 *  Have you tolerated food without any problems? Yes.    Have you been able to return to your normal activities? Yes.    Do you have any questions about your discharge instructions: Diet   No. Medications  No. Follow up visit  No.  Do you have questions or concerns about your Care? No.  Actions: * If pain score is 4 or above: No action needed, pain <4.  1. Have you developed a fever since your procedure? no  2.   Have you had an respiratory symptoms (SOB or cough) since your procedure? no  3.   Have you tested positive for COVID 19 since your procedure no  4.   Have you had any family members/close contacts diagnosed with the COVID 19 since your procedure?  no   If yes to any of these questions please route to Joylene John, RN and Erenest Rasher, RN

## 2020-02-03 ENCOUNTER — Telehealth: Payer: Self-pay | Admitting: Gastroenterology

## 2020-02-03 NOTE — Telephone Encounter (Signed)
Pt requested a call back to discuss CT.

## 2020-02-04 ENCOUNTER — Other Ambulatory Visit: Payer: Self-pay

## 2020-02-04 ENCOUNTER — Ambulatory Visit (INDEPENDENT_AMBULATORY_CARE_PROVIDER_SITE_OTHER)
Admission: RE | Admit: 2020-02-04 | Discharge: 2020-02-04 | Disposition: A | Discharge status: Home / Self Care | Payer: PPO | Source: Ambulatory Visit | Attending: Gastroenterology | Admitting: Gastroenterology

## 2020-02-04 DIAGNOSIS — R112 Nausea with vomiting, unspecified: Secondary | ICD-10-CM | POA: Diagnosis not present

## 2020-02-04 DIAGNOSIS — K3189 Other diseases of stomach and duodenum: Secondary | ICD-10-CM

## 2020-02-04 DIAGNOSIS — R1110 Vomiting, unspecified: Secondary | ICD-10-CM | POA: Diagnosis not present

## 2020-02-04 MED ORDER — IOHEXOL 300 MG/ML  SOLN
100.0000 mL | Freq: Once | INTRAMUSCULAR | Status: AC | PRN
  Administered 2020-02-04: 100 mL via INTRAVENOUS

## 2020-02-04 NOTE — Telephone Encounter (Signed)
Left message for patient to call back  

## 2020-02-04 NOTE — Telephone Encounter (Signed)
Patient arrived for CT.  I will await a return call or speak with him more when results are called

## 2020-02-04 NOTE — Telephone Encounter (Signed)
Patient is scheduled for today at 4:00.  I was unable to actually leave the message, because his VM is full

## 2020-02-05 ENCOUNTER — Other Ambulatory Visit: Payer: Self-pay

## 2020-02-05 ENCOUNTER — Telehealth: Payer: Self-pay

## 2020-02-05 DIAGNOSIS — K3189 Other diseases of stomach and duodenum: Secondary | ICD-10-CM

## 2020-02-05 NOTE — Telephone Encounter (Signed)
EUS scheduled, pt instructed and medications reviewed.  Patient instructions mailed to home.  Patient to call with any questions or concerns. COVID instructions also provided and pt confirmed he will be able to see My Chart information.

## 2020-02-05 NOTE — Telephone Encounter (Signed)
Left message on machine to call back  

## 2020-02-05 NOTE — Telephone Encounter (Signed)
-----   Message from Milus Banister, MD sent at 02/05/2020  8:00 AM EDT ----- Regarding: RE: possible EUS Definitely. We'll get him in. Thanks   Marilla Boddy, He needs upper EUS, first available with myself or Gabe for gastric mass.  Thanks   DJ ----- Message ----- From: [name], MD Sent: 02/05/2020   7:55 AM EDT To: Milus Banister, MD, # Subject: possible EUS                                   Hey guys, Did an EGD on this patient for some epigastric pain, he had a 3-4cm subepithelial lesion in the gastric body noted. Bite on bite biopsies unfortunately negative. CT done which shows the lesion. EUS reasonable to confirm dx of suspected GIST? Thanks.   Richardson Landry

## 2020-02-06 ENCOUNTER — Telehealth: Payer: Self-pay

## 2020-02-06 ENCOUNTER — Other Ambulatory Visit: Payer: Self-pay

## 2020-02-06 DIAGNOSIS — R932 Abnormal findings on diagnostic imaging of liver and biliary tract: Secondary | ICD-10-CM

## 2020-02-06 DIAGNOSIS — K3189 Other diseases of stomach and duodenum: Secondary | ICD-10-CM

## 2020-02-06 NOTE — Telephone Encounter (Signed)
Called and scheduled pt for MRI of Liver on Monday, 05-04-20 at 10:00am to arrive at 9:30am.

## 2020-03-02 ENCOUNTER — Other Ambulatory Visit (HOSPITAL_COMMUNITY)
Admission: RE | Admit: 2020-03-02 | Discharge: 2020-03-02 | Disposition: A | Discharge status: Home / Self Care | Payer: PPO | Source: Ambulatory Visit | Attending: Gastroenterology | Admitting: Gastroenterology

## 2020-03-02 DIAGNOSIS — Z20822 Contact with and (suspected) exposure to covid-19: Secondary | ICD-10-CM | POA: Diagnosis not present

## 2020-03-02 DIAGNOSIS — Z01812 Encounter for preprocedural laboratory examination: Secondary | ICD-10-CM | POA: Diagnosis not present

## 2020-03-02 LAB — SARS CORONAVIRUS 2 (TAT 6-24 HRS): SARS Coronavirus 2: NEGATIVE

## 2020-03-05 ENCOUNTER — Ambulatory Visit (HOSPITAL_COMMUNITY): Payer: PPO | Admitting: Anesthesiology

## 2020-03-05 ENCOUNTER — Ambulatory Visit (HOSPITAL_COMMUNITY)
Admission: RE | Admit: 2020-03-05 | Discharge: 2020-03-05 | Disposition: A | Discharge status: Home / Self Care | Payer: PPO | Attending: Gastroenterology | Admitting: Gastroenterology

## 2020-03-05 ENCOUNTER — Encounter (HOSPITAL_COMMUNITY): Payer: Self-pay | Admitting: Gastroenterology

## 2020-03-05 ENCOUNTER — Encounter (HOSPITAL_COMMUNITY)
Admission: RE | Disposition: A | Discharge status: Home / Self Care | Payer: Self-pay | Source: Home / Self Care | Attending: Gastroenterology

## 2020-03-05 ENCOUNTER — Other Ambulatory Visit: Payer: Self-pay

## 2020-03-05 DIAGNOSIS — D371 Neoplasm of uncertain behavior of stomach: Secondary | ICD-10-CM | POA: Diagnosis not present

## 2020-03-05 DIAGNOSIS — K3189 Other diseases of stomach and duodenum: Secondary | ICD-10-CM

## 2020-03-05 DIAGNOSIS — C49A2 Gastrointestinal stromal tumor of stomach: Secondary | ICD-10-CM

## 2020-03-05 DIAGNOSIS — M810 Age-related osteoporosis without current pathological fracture: Secondary | ICD-10-CM | POA: Diagnosis not present

## 2020-03-05 DIAGNOSIS — D490 Neoplasm of unspecified behavior of digestive system: Secondary | ICD-10-CM | POA: Diagnosis not present

## 2020-03-05 DIAGNOSIS — Z85828 Personal history of other malignant neoplasm of skin: Secondary | ICD-10-CM | POA: Insufficient documentation

## 2020-03-05 DIAGNOSIS — R935 Abnormal findings on diagnostic imaging of other abdominal regions, including retroperitoneum: Secondary | ICD-10-CM | POA: Diagnosis not present

## 2020-03-05 DIAGNOSIS — J45909 Unspecified asthma, uncomplicated: Secondary | ICD-10-CM | POA: Diagnosis not present

## 2020-03-05 HISTORY — PX: EUS: SHX5427

## 2020-03-05 HISTORY — PX: ESOPHAGOGASTRODUODENOSCOPY: SHX5428

## 2020-03-05 HISTORY — PX: FINE NEEDLE ASPIRATION: SHX5430

## 2020-03-05 SURGERY — UPPER ENDOSCOPIC ULTRASOUND (EUS) LINEAR
Anesthesia: Monitor Anesthesia Care

## 2020-03-05 MED ORDER — LACTATED RINGERS IV SOLN
INTRAVENOUS | Status: AC | PRN
  Administered 2020-03-05: 1000 mL via INTRAVENOUS

## 2020-03-05 MED ORDER — GLYCOPYRROLATE 0.2 MG/ML IJ SOLN
INTRAMUSCULAR | Status: DC | PRN
Start: 2020-03-05 — End: 2020-03-05
  Administered 2020-03-05: .1 mg via INTRAVENOUS

## 2020-03-05 MED ORDER — LIDOCAINE HCL (CARDIAC) PF 100 MG/5ML IV SOSY
PREFILLED_SYRINGE | INTRAVENOUS | Status: DC | PRN
  Administered 2020-03-05: 50 mg via INTRAVENOUS

## 2020-03-05 MED ORDER — LACTATED RINGERS IV SOLN: INTRAVENOUS | Status: DC | PRN

## 2020-03-05 MED ORDER — SODIUM CHLORIDE 0.9 % IV SOLN: INTRAVENOUS | Status: DC

## 2020-03-05 MED ORDER — PROPOFOL 500 MG/50ML IV EMUL
INTRAVENOUS | Status: DC | PRN
  Administered 2020-03-05: 175 ug/kg/min via INTRAVENOUS

## 2020-03-05 MED ORDER — SODIUM CHLORIDE 0.9 % IV SOLN
INTRAVENOUS | Status: DC | PRN
  Administered 2020-03-05: 80 ug via INTRAVENOUS

## 2020-03-05 NOTE — Anesthesia Procedure Notes (Signed)
Procedure Name: MAC Date/Time: 03/05/2020 10:21 AM Performed by: Lissa Morales, CRNA Pre-anesthesia Checklist: Patient identified, Emergency Drugs available, Suction available, Patient being monitored and Timeout performed Patient Re-evaluated:Patient Re-evaluated prior to induction Oxygen Delivery Method: Simple face mask Preoxygenation: Pre-oxygenation with 100% oxygen (pom mask) Placement Confirmation: positive ETCO2

## 2020-03-05 NOTE — Anesthesia Postprocedure Evaluation (Signed)
Anesthesia Post Note  Patient: Cory Thomas  Procedure(s) Performed: UPPER ENDOSCOPIC ULTRASOUND (EUS) LINEAR (N/A ) FINE NEEDLE ASPIRATION (FNA) LINEAR (N/A ) ESOPHAGOGASTRODUODENOSCOPY (EGD) (N/A )     Patient location during evaluation: PACU Anesthesia Type: MAC Level of consciousness: awake and alert Pain management: pain level controlled Vital Signs Assessment: post-procedure vital signs reviewed and stable Respiratory status: spontaneous breathing, nonlabored ventilation and respiratory function stable Cardiovascular status: stable and blood pressure returned to baseline Anesthetic complications: no   No complications documented.  Last Vitals:  Vitals:   03/05/20 1120 03/05/20 1130  BP: (!) 97/53 (!) 93/59  Pulse: (!) 55 (!) 50  Resp: 15 13  Temp:    SpO2: 100% 100%    Last Pain:  Vitals:   03/05/20 1130  TempSrc:   PainSc: 0-No pain                 Audry Pili

## 2020-03-05 NOTE — Discharge Instructions (Signed)
YOU HAD AN ENDOSCOPIC PROCEDURE TODAY: Refer to the procedure report and other information in the discharge instructions given to you for any specific questions about what was found during the examination. If this information does not answer your questions, please call [place] office at [phone] to clarify.  ° °YOU SHOULD EXPECT: Some feelings of bloating in the abdomen. Passage of more gas than usual. Walking can help get rid of the air that was put into your GI tract during the procedure and reduce the bloating. If you had a lower endoscopy (such as a colonoscopy or flexible sigmoidoscopy) you may notice spotting of blood in your stool or on the toilet paper. Some abdominal soreness may be present for a day or two, also. ° °DIET: Your first meal following the procedure should be a light meal and then it is ok to progress to your normal diet. A half-sandwich or bowl of soup is an example of a good first meal. Heavy or fried foods are harder to digest and may make you feel nauseous or bloated. Drink plenty of fluids but you should avoid alcoholic beverages for 24 hours. If you had a esophageal dilation, please see attached instructions for diet.   ° °ACTIVITY: Your care partner should take you home directly after the procedure. You should plan to take it easy, moving slowly for the rest of the day. You can resume normal activity the day after the procedure however YOU SHOULD NOT DRIVE, use power tools, machinery or perform tasks that involve climbing or major physical exertion for 24 hours (because of the sedation medicines used during the test).  ° °SYMPTOMS TO REPORT IMMEDIATELY: °A gastroenterologist can be reached at any hour. Please call [phone]  for any of the following symptoms:  °• Following lower endoscopy (colonoscopy, flexible sigmoidoscopy) °Excessive amounts of blood in the stool  °Significant tenderness, worsening of abdominal pains  °Swelling of the abdomen that is new, acute  °Fever of 100°  or higher  °• Following upper endoscopy (EGD, EUS, ERCP, esophageal dilation) °Vomiting of blood or coffee ground material  °New, significant abdominal pain  °New, significant chest pain or pain under the shoulder blades  °Painful or persistently difficult swallowing  °New shortness of breath  °Black, tarry-looking or red, bloody stools ° °FOLLOW UP:  °If any biopsies were taken you will be contacted by phone or by letter within the next 1-3 weeks. Call [phone]  if you have not heard about the biopsies in 3 weeks.  °Please also call with any specific questions about appointments or follow up tests.YOU HAD AN ENDOSCOPIC PROCEDURE TODAY: Refer to the procedure report and other information in the discharge instructions given to you for any specific questions about what was found during the examination. If this information does not answer your questions, please call [place] office at [phone] to clarify.  ° °YOU SHOULD EXPECT: Some feelings of bloating in the abdomen. Passage of more gas than usual. Walking can help get rid of the air that was put into your GI tract during the procedure and reduce the bloating. If you had a lower endoscopy (such as a colonoscopy or flexible sigmoidoscopy) you may notice spotting of blood in your stool or on the toilet paper. Some abdominal soreness may be present for a day or two, also. ° °DIET: Your first meal following the procedure should be a light meal and then it is ok to progress to your normal diet. A half-sandwich or bowl of soup is an example   of a good first meal. Heavy or fried foods are harder to digest and may make you feel nauseous or bloated. Drink plenty of fluids but you should avoid alcoholic beverages for 24 hours. If you had a esophageal dilation, please see attached instructions for diet.   ° °ACTIVITY: Your care partner should take you home directly after the procedure. You should plan to take it easy, moving slowly for the rest of the day. You can resume  normal activity the day after the procedure however YOU SHOULD NOT DRIVE, use power tools, machinery or perform tasks that involve climbing or major physical exertion for 24 hours (because of the sedation medicines used during the test).  ° °SYMPTOMS TO REPORT IMMEDIATELY: °A gastroenterologist can be reached at any hour. Please call [phone]  for any of the following symptoms:  °• Following upper endoscopy (EGD, EUS, ERCP, esophageal dilation) °Vomiting of blood or coffee ground material  °New, significant abdominal pain  °New, significant chest pain or pain under the shoulder blades  °Painful or persistently difficult swallowing  °New shortness of breath  °Black, tarry-looking or red, bloody stools ° °FOLLOW UP:  °If any biopsies were taken you will be contacted by phone or by letter within the next 1-3 weeks. Call [phone]  if you have not heard about the biopsies in 3 weeks.  °Please also call with any specific questions about appointments or follow up tests. °

## 2020-03-05 NOTE — H&P (Signed)
HPI: This is a man with incidental subepithelial lesion noted in stomach on recent EGD with Dr. Havery Moros. Was 3.1cm on follow up CT    ROS: complete GI ROS as described in HPI, all other review negative.  Constitutional:  No unintentional weight loss   Past Medical History:  Diagnosis Date  . Allergy   . Anxiety    per pt after father died  . Asthma    age 69  . Cancer ([place])    skin on arms and legs years ago rmoved over 10 yrs ago  . Gastritis    years ago  . Osteoporosis   . Refusal of blood product     Past Surgical History:  Procedure Laterality Date  . CLOSED REDUCTION WRIST FRACTURE     69 years old  . POLYPECTOMY  12/2018   colon  . TONSILLECTOMY      No current facility-administered medications for this encounter.    Allergies as of 02/05/2020  . (No Known Allergies)    Family History  Problem Relation Age of Onset  . Dementia Father   . Colon cancer Neg Hx   . Colon polyps Neg Hx   . Esophageal cancer Neg Hx   . Rectal cancer Neg Hx   . Stomach cancer Neg Hx     Social History   Socioeconomic History  . Marital status: Married    Spouse name: Not on file  . Number of children: Not on file  . Years of education: Not on file  . Highest education level: Not on file  Occupational History  . Occupation: Probation officer  Tobacco Use  . Smoking status: Never Smoker  . Smokeless tobacco: Never Used  Substance and Sexual Activity  . Alcohol use: Yes    Alcohol/week: 3.0 standard drinks    Types: 3 Glasses of wine per week    Comment: 1-2 bottles of beer/week, red wine  . Drug use: Never  . Sexual activity: Not on file  Other Topics Concern  . Not on file  Social History Narrative  . Not on file   Social Determinants of Health   Financial Resource Strain:   . Difficulty of Paying Living Expenses:   Food Insecurity:   . Worried About Charity fundraiser in the Last Year:   . Arboriculturist in the Last Year:   Transportation  Needs:   . Film/video editor (Medical):   Marland Kitchen Lack of Transportation (Non-Medical):   Physical Activity:   . Days of Exercise per Week:   . Minutes of Exercise per Session:   Stress:   . Feeling of Stress :   Social Connections:   . Frequency of Communication with Friends and Family:   . Frequency of Social Gatherings with Friends and Family:   . Attends Religious Services:   . Active Member of Clubs or Organizations:   . Attends Archivist Meetings:   Marland Kitchen Marital Status:   Intimate Partner Violence:   . Fear of Current or Ex-Partner:   . Emotionally Abused:   Marland Kitchen Physically Abused:   . Sexually Abused:      Physical Exam: Ht 5\' 10"  (1.778 m)   Wt 63.5 kg   BMI 20.09 kg/m  Constitutional: generally well-appearing Psychiatric: alert and oriented x3 Abdomen: soft, nontender, nondistended, no obvious ascites, no peritoneal signs, normal bowel sounds No peripheral edema noted in lower extremities  Assessment and plan: 69 y.o. male with abnormal stomach  For EUS and FNA today  Please see the "Patient Instructions" section for addition details about the plan.  Owens Loffler, MD [facility] 03/05/2020, 9:24 AM

## 2020-03-05 NOTE — Transfer of Care (Signed)
Immediate Anesthesia Transfer of Care Note  Patient: Cory Thomas  Procedure(s) Performed: UPPER ENDOSCOPIC ULTRASOUND (EUS) RADIAL (N/A ) FINE NEEDLE ASPIRATION (FNA) LINEAR (N/A )  Patient Location: PACU  Anesthesia Type:MAC  Level of Consciousness: sedated and patient cooperative  Airway & Oxygen Therapy: Patient Spontanous Breathing and Patient connected to face mask oxygen  Post-op Assessment: Report given to RN and Post -op Vital signs reviewed and stable  Post vital signs: stable  Last Vitals:  Vitals Value Taken Time  BP 90/45 03/05/20 1100  Temp    Pulse 51 03/05/20 1103  Resp 9 03/05/20 1103  SpO2 100 % 03/05/20 1103  Vitals shown include unvalidated device data.  Last Pain:  Vitals:   03/05/20 0943  TempSrc: Oral  PainSc: 0-No pain         Complications: No complications documented.

## 2020-03-05 NOTE — Op Note (Signed)
[facility] Patient Name: Cory Thomas Procedure Date: 03/05/2020 MRN: [id number] Attending MD: Milus Banister , MD Date of Birth: [birth year] CSN: [id number] Age: 69 Admit Type: Outpatient Procedure:                Upper EUS Indications:              Incidental gastric subepithelial nodule noted on                            recent EGD with Dr. Havery Moros. CT scan confirmed                            3.1cm lesion along posterior wall of gastric body Providers:                Milus Banister, MD, Clyde Lundborg, RN, Glori Bickers, RN, Cherylynn Ridges, Technician, Enrigue Catena,                            CRNA Referring MD:              Medicines:                Monitored Anesthesia Care Complications:            No immediate complications. Estimated blood loss:                            None. Estimated Blood Loss:     Estimated blood loss: none. Procedure:                Pre-Anesthesia Assessment:                           - Prior to the procedure, a History and Physical                            was performed, and patient medications and                            allergies were reviewed. The patient's tolerance of                            previous anesthesia was also reviewed. The risks                            and benefits of the procedure and the sedation                            options and risks were discussed with the patient.                            All questions were answered, and informed consent  was obtained. Prior Anticoagulants: The patient has                            taken no previous anticoagulant or antiplatelet                            agents. ASA Grade Assessment: II - A patient with                            mild systemic disease. After reviewing the risks                            and benefits, the patient was deemed in                            satisfactory condition to undergo  the procedure.                           After obtaining informed consent, the endoscope was                            passed under direct vision. Throughout the                            procedure, the patient's blood pressure, pulse, and                            oxygen saturations were monitored continuously. The                            [id number] ([id number]) Olympus Linear EUS was                            introduced through the mouth, and advanced to the                            antrum of the stomach. The upper EUS was                            accomplished without difficulty. The patient                            tolerated the procedure well. Scope In: Scope Out: Findings:      ENDOSCOPIC FINDING: :      The examined esophagus was endoscopically normal.      There was a medium sized subepithelial mass along the posterior wall of       the mid gastric body. Normal overlying mucosa.      ENDOSONOGRAPHIC FINDING: :      1. The subepithelial gastric lesion described above correlated with a       2.8cm round, hypoechoic mass with discrete outer margins and clear       communication with the muscularis propria layer of the gastric wall. The       mass was sampled with a single pass  with EUS FNB needle using color       Doppler.      2. Limited views of liver, spleen, pancreas, portal and splenic vessels       were all normal.      3. No perigastric adenopathy. 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                            Sampled today with EUS FNB, preliminary cytology                            review shows spindle cells. Await final testing.                           - If this is confirmed to be a gastric GIST he                            should be considered for surgical resection. Moderate Sedation:      Not Applicable - Patient had care per  Anesthesia. Recommendation:           - Discharge patient to home (ambulatory). Procedure Code(s):        --- Professional ---                           [phone], 63, Esophagogastroduodenoscopy, flexible,                            transoral; with transendoscopic ultrasound-guided                            intramural or transmural fine needle                            aspiration/biopsy(s), (includes endoscopic                            ultrasound examination limited to the esophagus,                            stomach or duodenum, and adjacent structures) Diagnosis Code(s):        --- Professional ---                           K31.89, Other diseases of stomach and duodenum CPT copyright 2019 American Medical Association. All rights reserved. The codes documented in this report are preliminary and upon coder review may  be revised to meet current compliance requirements. Milus Banister, MD 03/05/2020 11:06:59 AM This report has been signed electronically. Number of Addenda: 0

## 2020-03-05 NOTE — Anesthesia Preprocedure Evaluation (Addendum)
Anesthesia Evaluation  Patient identified by MRN, date of birth, ID band Patient awake    Reviewed: Allergy & Precautions, NPO status , Patient's Chart, lab work & pertinent test results  History of Anesthesia Complications Negative for: history of anesthetic complications  Airway Mallampati: I  TM Distance: >3 FB Neck ROM: Full    Dental  (+) Dental Advisory Given, Teeth Intact   Pulmonary asthma ,    Pulmonary exam normal        Cardiovascular negative cardio ROS Normal cardiovascular exam     Neuro/Psych PSYCHIATRIC DISORDERS Anxiety negative neurological ROS     GI/Hepatic Neg liver ROS,  Gastric mass    Endo/Other  negative endocrine ROS  Renal/GU negative Renal ROS     Musculoskeletal negative musculoskeletal ROS (+)   Abdominal   Peds  Hematology  (+) REFUSES BLOOD PRODUCTS, JEHOVAH'S WITNESS  Anesthesia Other Findings Covid test negative   Reproductive/Obstetrics                            Anesthesia Physical Anesthesia Plan  ASA: II  Anesthesia Plan: MAC   Post-op Pain Management:    Induction: Intravenous  PONV Risk Score and Plan: 1 and Propofol infusion and Treatment may vary due to age or medical condition  Airway Management Planned: Nasal Cannula and Natural Airway  Additional Equipment: None  Intra-op Plan:   Post-operative Plan:   Informed Consent: I have reviewed the patients History and Physical, chart, labs and discussed the procedure including the risks, benefits and alternatives for the proposed anesthesia with the patient or authorized representative who has indicated his/her understanding and acceptance.       Plan Discussed with: CRNA and Anesthesiologist  Anesthesia Plan Comments:        Anesthesia Quick Evaluation

## 2020-03-06 ENCOUNTER — Encounter (HOSPITAL_COMMUNITY): Payer: Self-pay | Admitting: Gastroenterology

## 2020-03-09 LAB — CYTOLOGY - NON PAP

## 2020-03-13 ENCOUNTER — Telehealth: Payer: Self-pay | Admitting: Gastroenterology

## 2020-03-13 NOTE — Telephone Encounter (Signed)
Pt called inquiring about [facility] results.

## 2020-03-13 NOTE — Telephone Encounter (Signed)
The pt was asking about [facility] referral. I notified pt that referral was made on 6/29 and [facility] will call him with an appt. He was given the phone number to [facility] to call and check on the referral if he would like.

## 2020-03-18 NOTE — Telephone Encounter (Signed)
Referral refaxed as well as 6/29 confirmation of original faxed referral

## 2020-03-18 NOTE — Telephone Encounter (Signed)
Patient states [facility] hasn't received the  Referral. Can we please re-send to [place] fax# [phone]

## 2020-04-15 ENCOUNTER — Ambulatory Visit: Payer: Self-pay | Admitting: Surgery

## 2020-04-15 ENCOUNTER — Encounter: Payer: Self-pay | Admitting: Surgery

## 2020-04-15 ENCOUNTER — Telehealth: Payer: Self-pay

## 2020-04-15 DIAGNOSIS — R1310 Dysphagia, unspecified: Secondary | ICD-10-CM | POA: Insufficient documentation

## 2020-04-15 DIAGNOSIS — R140 Abdominal distension (gaseous): Secondary | ICD-10-CM | POA: Insufficient documentation

## 2020-04-15 DIAGNOSIS — C49A2 Gastrointestinal stromal tumor of stomach: Secondary | ICD-10-CM | POA: Diagnosis not present

## 2020-04-15 DIAGNOSIS — R1110 Vomiting, unspecified: Secondary | ICD-10-CM | POA: Diagnosis not present

## 2020-04-15 DIAGNOSIS — Z789 Other specified health status: Secondary | ICD-10-CM | POA: Diagnosis not present

## 2020-04-15 DIAGNOSIS — K222 Esophageal obstruction: Secondary | ICD-10-CM | POA: Insufficient documentation

## 2020-04-15 DIAGNOSIS — R6881 Early satiety: Secondary | ICD-10-CM | POA: Diagnosis not present

## 2020-04-15 DIAGNOSIS — Z531 Procedure and treatment not carried out because of patient's decision for reasons of belief and group pressure: Secondary | ICD-10-CM

## 2020-04-15 NOTE — Telephone Encounter (Signed)
error 

## 2020-04-15 NOTE — H&P (Signed)
Cory Thomas Appointment: 04/15/2020 9:30 AM Location: [facility] Patient #: [id number] DOB: 01/29/1951 Married / Language: [name] / Race: White Male  History of Present Illness Adin Hector MD; 04/15/2020 10:21 AM) The patient is a 69 year old male who presents with a complaint of GIST tumor of Stomach. ` ` ` Patient sent for surgical consultation at the request of Dr Havery Moros and Ardis Hughs with [facility] gastroenterology.  Chief Complaint: Posterior gastric wall mass most likely a gastrointestinal stromal tumor. ` ` The patient is a pleasant active male. He's had some complaints of nausea vomiting and bloating. Early satiety. Saw gastroenterology. Patient concerned that maybe he has pylorus dysfunction. Had a CAT scan done which showed a possible posterior gastric wall mass. Endoscopy confirmed this. 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 Surgical consultation requested. Patient also seemed to have some narrowing/stricturing of his distal esophagus was dilated. Patient notes some of his dysphagia has gone down but he still has early satiety and bloating. More frequent meals. He is a thin male quite active. No major weight loss. No hematemesis or coffee-ground emesis. He is not diabetic. Not on narcotics. No prior abdominal surgery. Usually moves his bowels twice a day. No severe constipation. 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 Actually has some plans to graft like to [employer] this fall. He does not smoke.  No personal nor family history of GI/colon cancer, inflammatory bowel disease, irritable bowel syndrome, allergy such as Celiac Sprue, dietary/dairy problems, colitis, ulcers nor gastritis. No recent sick contacts/gastroenteritis. No travel outside the country. No changes in diet. He does have some problems swallowing meats and thicker foods. He thinks the  dilation helped a little bit but it is still not normal. No dysphagia with liquids. No significant heartburn or reflux. No increased burping or belching. No melena, hematemesis, coffee ground emesis. No evidence of prior gastric/peptic ulceration.  (Review of systems as stated in this history (HPI) or in the review of systems. Otherwise all other 12 point ROS are negative) ` ` `  This patient encounter took 40 minutes today to perform the following: obtain history, perform exam, review outside records, interpret tests & imaging, counsel the patient on their diagnosis; and, document this encounter, including findings & plan in the electronic health record (EHR).   Past Surgical History Antonietta Jewel, [place]; 04/15/2020 9:31 AM) Colon Polyp Removal - Colonoscopy  Diagnostic Studies History [facility] Teressa Senter, CMA; 04/15/2020 9:31 AM) Colonoscopy 1-5 years ago  Allergies (Chanel Teressa Senter, CMA; 04/15/2020 9:32 AM) No Known Drug Allergies [04/15/2020]: Allergies Reconciled  Medication History (Chanel Teressa Senter, [place]; 04/15/2020 9:32 AM) No Current Medications Medications Reconciled  Social History Adin Hector, MD; 04/15/2020 10:11 AM) Originally from [place]  Other Problems Antonietta Jewel, [place]; 04/15/2020 9:31 AM) Anxiety Disorder Asthma Melanoma     Review of Systems ([place]; 04/15/2020 9:31 AM) HEENT Present- Wears glasses/contact lenses. Not Present- Earache, Hearing Loss, Hoarseness, Nose Bleed, Oral Ulcers, Ringing in the Ears, Seasonal Allergies, Sinus Pain, Sore Throat, Visual Disturbances and Yellow Eyes. Gastrointestinal Present- Gets full quickly at meals. Not Present- Abdominal Pain, Bloating, Bloody Stool, Change in Bowel Habits, Chronic diarrhea, Constipation, Difficulty Swallowing, Excessive gas, Hemorrhoids, Indigestion, Nausea, Rectal Pain and Vomiting.  Vitals (Chanel Nolan CMA; 04/15/2020 9:32 AM) 04/15/2020 9:32  AM Weight: 140 lb Height: 70in Body Surface Area: 1.79 m Body Mass Index: 20.09 kg/m  Temp.:  98.26F  Pulse: 80 (Regular)  BP: 118/74(Sitting, Left Arm, Standard)        Physical Exam Adin Hector MD; 04/15/2020 10:14 AM)  General Mental Status-Alert. General Appearance-Not in acute distress, Not Sickly. Orientation-Oriented X3. Hydration-Well hydrated. Voice-Normal.  Integumentary Global Assessment Upon inspection and palpation of skin surfaces of the - Axillae: non-tender, no inflammation or ulceration, no drainage. and Distribution of scalp and body hair is normal. General Characteristics Temperature - normal warmth is noted.  Head and Neck Head-normocephalic, atraumatic with no lesions or palpable masses. Face Global Assessment - atraumatic, no absence of expression. Neck Global Assessment - no abnormal movements, no bruit auscultated on the right, no bruit auscultated on the left, no decreased range of motion, non-tender. Trachea-midline. Thyroid Gland Characteristics - non-tender.  Eye Eyeball - Left-Extraocular movements intact, No Nystagmus - Left. Eyeball - Right-Extraocular movements intact, No Nystagmus - Right. Cornea - Left-No Hazy - Left. Cornea - Right-No Hazy - Right. Sclera/Conjunctiva - Left-No scleral icterus, No Discharge - Left. Sclera/Conjunctiva - Right-No scleral icterus, No Discharge - Right. Pupil - Left-Direct reaction to light normal. Pupil - Right-Direct reaction to light normal.  ENMT Ears Pinna - Left - no drainage observed, no generalized tenderness observed. Pinna - Right - no drainage observed, no generalized tenderness observed. Nose and Sinuses External Inspection of the Nose - no destructive lesion observed. Inspection of the nares - Left - quiet respiration. Inspection of the nares - Right - quiet respiration. Mouth and Throat Lips - Upper Lip - no fissures observed, no pallor  noted. Lower Lip - no fissures observed, no pallor noted. Nasopharynx - no discharge present. Oral Cavity/Oropharynx - Tongue - no dryness observed. Oral Mucosa - no cyanosis observed. Hypopharynx - no evidence of airway distress observed.  Chest and Lung Exam Inspection Movements - Normal and Symmetrical. Accessory muscles - No use of accessory muscles in breathing. Palpation Palpation of the chest reveals - Non-tender. Auscultation Breath sounds - Normal and Clear.  Cardiovascular Auscultation Rhythm - Regular. Murmurs & Other Heart Sounds - Auscultation of the heart reveals - No Murmurs and No Systolic Clicks.  Abdomen Inspection Inspection of the abdomen reveals - No Visible peristalsis and No Abnormal pulsations. Umbilicus - No Bleeding, No Urine drainage. Palpation/Percussion Palpation and Percussion of the abdomen reveal - Soft, Non Tender, No Rebound tenderness, No Rigidity (guarding) and No Cutaneous hyperesthesia. Note: Abdomen soft. Nontender. Not distended. No umbilical or incisional hernias. No guarding.  Male Genitourinary Sexual Maturity Tanner 5 - Adult hair pattern and Adult penile size and shape.  Peripheral Vascular Upper Extremity Inspection - Left - No Cyanotic nailbeds - Left, Not Ischemic. Inspection - Right - No Cyanotic nailbeds - Right, Not Ischemic.  Neurologic Neurologic evaluation reveals -normal attention span and ability to concentrate, able to name objects and repeat phrases. Appropriate fund of knowledge , normal sensation and normal coordination. Mental Status Affect - not angry, not paranoid. Cranial Nerves-Normal Bilaterally. Gait-Normal.  Neuropsychiatric Mental status exam performed with findings of-able to articulate well with normal speech/language, rate, volume and coherence, thought content normal with ability to perform basic computations and apply abstract reasoning and no evidence of hallucinations, delusions, obsessions  or homicidal/suicidal ideation.  Musculoskeletal Global Assessment Spine, Ribs and Pelvis - no instability, subluxation or laxity. Right Upper Extremity - no instability, subluxation or laxity.  Lymphatic Head & Neck  General Head & Neck Lymphatics: Bilateral - Description - No Localized lymphadenopathy. Axillary  General Axillary Region: Bilateral - Description -  No Localized lymphadenopathy. Femoral & Inguinal  Generalized Femoral & Inguinal Lymphatics: Left - Description - No Localized lymphadenopathy. Right - Description - No Localized lymphadenopathy.    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 Noticeable on endoscopy and CAT scan. Most likely posterior body/antrum of mid stomach.  I think he would benefit from wedge resection of the mass. Most likely robotic mobilization to get to the posterior wall and stapling. Possible intraoperative endoscopy. He is interested in proceeding.  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 Hopefully the risk of that is low.  He denies idea of a minimally invasive robotic approach and wishes to proceed. Hopefully he will recover safely as he is otherwise quite physically active.  Current Plans You are being scheduled for surgery- Our schedulers will call you.  You should hear from our office's scheduling department within 5 working days about the location, date, and time of surgery. We try to make accommodations for patient's preferences in scheduling surgery, but sometimes the OR schedule or the surgeon's schedule prevents [place] from making those accommodations.  If you have not heard from our office [phone]) in 5 working days, call the office and ask for your surgeon's nurse.  If you have other questions about your diagnosis, plan, or surgery, call  the office and ask for your surgeon's nurse.  Follow Up - Call [facility] office after tests / studies doneto discuss further plans The anatomy & physiology of the foregut and anti-reflux mechanism was discussed. The pathophysiology of Gastrointestinal stromal tumors (GISTs) and differential diagnosis was discussed. Natural history risks without surgery was discussed. The patient's symptoms are not adequately controlled by medicines and other non-operative treatments. I feel the risks of no intervention will lead to serious problems that outweigh the operative risks; therefore, I recommended surgery to remove the mass. Most likely involving partial gastrectomy wedge resection. Need for a thorough workup to rule out the differential diagnosis and plan treatment was explained. I explained laparoscopic techniques with possible need for an open approach. I will need to remove the mass en bloc. Therefore I will need extraction incision. Possible hand assist port as well.  Risks such as bleeding, infection, abscess, leak, need for further treatment, heart attack, death, and other risks were discussed. I noted a good likelihood this will help address the problem. Goals of post-operative recovery were discussed as well. Possibility that this will not correct all symptoms was explained. Post-operative dysphagia, need for short-term liquid & pureed diet, possible need for medicines to help control symptoms in addition to surgery were discussed. We will work to minimize complications. Possible need for Gleevec our Sutent postoperatively depending on the aggressiveness of the tumor was discussed as well. That would involve medical oncology consultation. Educational handouts further explaining the pathology, treatment options was given as well. Questions were answered. The patient expressed understanding & wished to proceed with surgery.  Pt Education - [facility] Laparoscopic Surgery HCI Pt Education - [facility]  Esophageal Surgery Diet HCI (Nashanti Duquette): discussed with patient and provided information.  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 I wonder if that is the source of his dysphagia. He is not as convinced. We'll defer back to gastroenterology.   EARLY SATIETY (R68.81) Impression: With his history  of early satiety bloating and nausea vomiting, he is concerned that he may have pyloric dysfunction as etiology. I think it is reasonable to get a gastric emptying study to help rule out and around. Hopefully not too likely since he does not have serious risk factors. However with the stomach surgery I think to be helpful to get a baseline preop since he is most likely going to have persistent complaints postoperative. If he has no gastroparesis or evidence of abnormaly gastric emptying, then can revisit the idea that perhaps is related to his esophageal stricture or consider manometry rule out esophageal disorders. Would defer back to gas GI. If he truly has severe delayed gastric emptying, he may benefit from pyloroplasty at the same time of surgery. Would like to know that preop.   PATIENT IS JEHOVAH'S WITNESS (Z78.9) Impression: Patient very involved in [employer] in works with the hospital. He would like to avoid transfusions. Hopefully his low risk since he is not severely anemic and otherwise healthy and active.  Adin Hector, MD, FACS, MASCRS Gastrointestinal and Minimally Invasive Surgery  [facility] [address], [place], [place] [address] [phone] Fax [phone] Main/Paging  CONTACT INFORMATION: Weekday (9AM-5PM) concerns: Call [facility] main office at [phone] Weeknight (5PM-9AM) or Weekend/Holiday concerns: Check [email or web address] for General Surgery [facility] coverage (Please, do not use SecureChat as it is not reliable  communication to operating surgeons for immediate patient care)

## 2020-04-18 ENCOUNTER — Other Ambulatory Visit (HOSPITAL_COMMUNITY): Payer: Self-pay | Admitting: Surgery

## 2020-04-18 ENCOUNTER — Other Ambulatory Visit: Payer: Self-pay | Admitting: Surgery

## 2020-04-18 DIAGNOSIS — R1110 Vomiting, unspecified: Secondary | ICD-10-CM

## 2020-04-28 NOTE — Progress Notes (Addendum)
COVID Vaccine Completed: x2 Date COVID Vaccine completed:  2/21 COVID vaccine manufacturer: [place]   PCP - Agustina Caroli, MD Cardiologist -   Chest x-ray -  EKG -  Stress Test -  ECHO -  Cardiac Cath -   Sleep Study -  CPAP -   Fasting Blood Sugar -  Checks Blood Sugar _____ times a day  Blood Thinner Instructions: Aspirin Instructions: Last Dose:  Anesthesia review:   Patient denies shortness of breath, fever, cough and chest pain at PAT appointment   Patient verbalized understanding of instructions that were given to them at the PAT appointment. Patient was also instructed that they will need to review over the PAT instructions again at home before surgery.

## 2020-04-28 NOTE — Patient Instructions (Addendum)
DUE TO COVID-19 ONLY ONE VISITOR IS ALLOWED TO COME WITH YOU AND STAY IN THE WAITING ROOM ONLY DURING PRE OP AND PROCEDURE.   IF YOU WILL BE ADMITTED INTO THE HOSPITAL YOU ARE ALLOWED ONE SUPPORT PERSON DURING VISITATION HOURS ONLY (10AM -8PM)   . The support person may change daily. . The support person must pass our screening, gel in and out, and wear a mask at all times, including in the patient's room. . Patients must also wear a mask when staff or their support person are in the room.   COVID SWAB TESTING MUST BE COMPLETED ON:  Saturday, 05-09-2020 @ 9:20 AM    [address] [place], [place] [address]  (Must self quarantine after testing. Follow instructions on handout.)        Your procedure is scheduled on Wednesday, 05-13-2020   Report to [facility] Main  Entrance     Report to admitting at 1:00 PM   Call this number if you have problems the morning of surgery [phone]   Do not eat food :After Midnight.   May have liquids until  12:00 PM  day of surgery  CLEAR LIQUID DIET  Foods Allowed                                                                     Foods Excluded  Water, Black Coffee and tea, regular and decaf             liquids that you cannot  Plain Jell-O in any flavor  (No red)                                    see through such as: Fruit ices (not with fruit pulp)                                      milk, soups, orange juice              Iced Popsicles (No red)                                      All solid food                                   Apple juices Sports drinks like Gatorade (No red) Lightly seasoned clear broth or consume(fat free) Sugar, honey syrup   Sample Menu Breakfast                                Lunch                                     Supper Cranberry juice  Beef broth                            Chicken broth Jell-O                                     Grape juice                           Apple  juice Coffee or tea                        Jell-O                                      Popsicle                                                  Coffee or tea                        Coffee or tea      Drink 2 Ensure drinks the night before surgery.  Complete one Ensure drink the morning of surgery at  12:00 PM.   Oral Hygiene is also important to reduce your risk of infection.                                    Remember - BRUSH YOUR TEETH THE MORNING OF SURGERY WITH YOUR REGULAR TOOTHPASTE   Do NOT smoke after Midnight   Take these medicines the morning of surgery with A SIP OF WATER: None                             You may not have any metal on your body including jewelry, and body piercings              Do not wear lotions, powders, perfumes/cologne, or deodorant                         Men may shave face and neck.     Do not bring valuables to the hospital. [place].   Contacts, dentures or bridgework may not be worn into surgery.   Bring small overnight bag day of surgery.     Special Instructions: Bring a copy of your healthcare power of attorney and living will documents         the day of surgery if you haven't scanned them in before.                Please read over the following fact sheets you were given: IF YOU HAVE QUESTIONS ABOUT YOUR PRE OP INSTRUCTIONS PLEASE CALL [phone]   [facility] - Preparing for Surgery Before surgery, you can play an important role.  Because skin is not sterile, your skin needs to be as free of germs as possible.  You can reduce the number of  germs on your skin by washing with CHG (chlorahexidine gluconate) soap before surgery.  CHG is an antiseptic cleaner which kills germs and bonds with the skin to continue killing germs even after washing. Please DO NOT use if you have an allergy to CHG or antibacterial soaps.  If your skin becomes reddened/irritated stop using the CHG and inform your nurse when  you arrive at Short Stay. Do not shave (including legs and underarms) for at least 48 hours prior to the first CHG shower.  You may shave your face/neck.  Please follow these instructions carefully:  1.  Shower with CHG Soap the night before surgery and the  morning of surgery.  2.  If you choose to wash your hair, wash your hair first as usual with your normal  shampoo.  3.  After you shampoo, rinse your hair and body thoroughly to remove the shampoo.                             4.  Use CHG as you would any other liquid soap.  You can apply chg directly to the skin and wash.  Gently with a scrungie or clean washcloth.  5.  Apply the CHG Soap to your body ONLY FROM THE NECK DOWN.   Do   not use on face/ open                           Wound or open sores. Avoid contact with eyes, ears mouth and   genitals (private parts).                       Wash face,  Genitals (private parts) with your normal soap.             6.  Wash thoroughly, paying special attention to the area where your    surgery  will be performed.  7.  Thoroughly rinse your body with warm water from the neck down.  8.  DO NOT shower/wash with your normal soap after using and rinsing off the CHG Soap.                9.  Pat yourself dry with a clean towel.            10.  Wear clean pajamas.            11.  Place clean sheets on your bed the night of your first shower and do not  sleep with pets. Day of Surgery : Do not apply any lotions/deodorants the morning of surgery.  Please wear clean clothes to the hospital/surgery center.  FAILURE TO FOLLOW THESE INSTRUCTIONS MAY RESULT IN THE CANCELLATION OF YOUR SURGERY  PATIENT SIGNATURE_________________________________  NURSE SIGNATURE__________________________________  ________________________________________________________________________

## 2020-05-04 ENCOUNTER — Encounter (HOSPITAL_COMMUNITY)
Admission: RE | Admit: 2020-05-04 | Discharge: 2020-05-04 | Disposition: A | Discharge status: Home / Self Care | Payer: PPO | Source: Ambulatory Visit | Attending: Gastroenterology | Admitting: Gastroenterology

## 2020-05-04 ENCOUNTER — Ambulatory Visit (HOSPITAL_COMMUNITY)
Admission: RE | Admit: 2020-05-04 | Discharge: 2020-05-04 | Disposition: A | Discharge status: Home / Self Care | Payer: PPO | Source: Ambulatory Visit | Attending: Gastroenterology | Admitting: Gastroenterology

## 2020-05-04 ENCOUNTER — Encounter (HOSPITAL_COMMUNITY): Payer: Self-pay

## 2020-05-04 ENCOUNTER — Other Ambulatory Visit: Payer: Self-pay

## 2020-05-04 DIAGNOSIS — K3189 Other diseases of stomach and duodenum: Secondary | ICD-10-CM | POA: Insufficient documentation

## 2020-05-04 DIAGNOSIS — C49A0 Gastrointestinal stromal tumor, unspecified site: Secondary | ICD-10-CM | POA: Diagnosis not present

## 2020-05-04 DIAGNOSIS — R932 Abnormal findings on diagnostic imaging of liver and biliary tract: Secondary | ICD-10-CM | POA: Insufficient documentation

## 2020-05-04 DIAGNOSIS — N281 Cyst of kidney, acquired: Secondary | ICD-10-CM | POA: Diagnosis not present

## 2020-05-04 LAB — CBC
HCT: 47.7 % (ref 39.0–52.0)
Hemoglobin: 15.8 g/dL (ref 13.0–17.0)
MCH: 31 pg (ref 26.0–34.0)
MCHC: 33.1 g/dL (ref 30.0–36.0)
MCV: 93.5 fL (ref 80.0–100.0)
Platelets: 148 10*3/uL — ABNORMAL LOW (ref 150–400)
RBC: 5.1 MIL/uL (ref 4.22–5.81)
RDW: 12.8 % (ref 11.5–15.5)
WBC: 4.6 10*3/uL (ref 4.0–10.5)
nRBC: 0 % (ref 0.0–0.2)

## 2020-05-04 LAB — NO BLOOD PRODUCTS

## 2020-05-04 MED ORDER — GADOBUTROL 1 MMOL/ML IV SOLN
6.0000 mL | Freq: Once | INTRAVENOUS | Status: AC | PRN
  Administered 2020-05-04: 6 mL via INTRAVENOUS

## 2020-05-04 NOTE — Progress Notes (Signed)
Spoke to Cory Thomas and Dr. Johney Maine' office and advised her that patient signed blood refusal form for upcoming procedure 05/13/20.

## 2020-05-05 ENCOUNTER — Encounter: Payer: Self-pay | Admitting: Emergency Medicine

## 2020-05-05 ENCOUNTER — Ambulatory Visit (HOSPITAL_COMMUNITY)
Admission: RE | Admit: 2020-05-05 | Discharge: 2020-05-05 | Disposition: A | Discharge status: Home / Self Care | Payer: PPO | Source: Ambulatory Visit | Attending: Surgery | Admitting: Surgery

## 2020-05-05 DIAGNOSIS — R112 Nausea with vomiting, unspecified: Secondary | ICD-10-CM | POA: Diagnosis not present

## 2020-05-05 DIAGNOSIS — R1110 Vomiting, unspecified: Secondary | ICD-10-CM | POA: Diagnosis not present

## 2020-05-05 DIAGNOSIS — R109 Unspecified abdominal pain: Secondary | ICD-10-CM | POA: Diagnosis not present

## 2020-05-05 MED ORDER — TECHNETIUM TC 99M SULFUR COLLOID
1.9800 | Freq: Once | INTRAVENOUS | Status: AC | PRN
  Administered 2020-05-05: 1.98 via INTRAVENOUS

## 2020-05-06 NOTE — Telephone Encounter (Signed)
Last time I saw him was last February.  Who ordered these tests?

## 2020-05-09 ENCOUNTER — Other Ambulatory Visit (HOSPITAL_COMMUNITY): Payer: PPO

## 2020-05-10 ENCOUNTER — Encounter: Payer: Self-pay | Admitting: Emergency Medicine

## 2020-06-15 NOTE — Progress Notes (Addendum)
COVID Vaccine Completed:  x2 Date COVID Vaccine completed:  2nd injection 2/21.  Booster on 06/08/20 COVID vaccine manufacturer: [place]   PCP - Agustina Caroli, MD Cardiologist -   Chest x-ray -  EKG -  Stress Test -  ECHO -  Cardiac Cath -  Pacemaker/ICD device last checked:  Sleep Study -  CPAP -   Fasting Blood Sugar -  Checks Blood Sugar _____ times a day  Blood Thinner Instructions: Aspirin Instructions: Last Dose:  Anesthesia review:   Patient denies shortness of breath, fever, cough and chest pain at PAT appointment   Patient verbalized understanding of instructions that were given to them at the PAT appointment. Patient was also instructed that they will need to review over the PAT instructions again at home before surgery.

## 2020-06-15 NOTE — Patient Instructions (Addendum)
DUE TO COVID-19 ONLY ONE VISITOR IS ALLOWED TO COME WITH YOU AND STAY IN THE WAITING ROOM ONLY DURING PRE OP AND PROCEDURE.   IF YOU WILL BE ADMITTED INTO THE HOSPITAL YOU ARE ALLOWED ONE SUPPORT PERSON DURING VISITATION HOURS ONLY (10AM -8PM)   . The support person may change daily. . The support person must pass our screening, gel in and out, and wear a mask at all times, including in the patient's room. . Patients must also wear a mask when staff or their support person are in the room.   COVID SWAB TESTING MUST BE COMPLETED ON:   Saturday, 06-20-20 @ 9:00 AM   [address] [place], [place] [address]  (Must self quarantine after testing. Follow instructions on handout.)        Your procedure is scheduled on:  Wednesday, 06-24-20   Report to [facility] Main  Entrance    Report to admitting at 6:30 AM   Call this number if you have problems the morning of surgery [phone]   Do not eat food :After Midnight.   May have liquids until 5:30 AM  day of surgery  CLEAR LIQUID DIET  Foods Allowed                                                                     Foods Excluded  Water, Black Coffee and tea, regular and decaf              liquids that you cannot  Plain Jell-O in any flavor  (No red)                                    see through such as: Fruit ices (not with fruit pulp)                                      milk, soups, orange juice              Iced Popsicles (No red)                                      All solid food                                   Apple juices Sports drinks like Gatorade (No red) Lightly seasoned clear broth or consume(fat free) Sugar, honey syrup     Drink 2 Ensure drinks the night before surgery.   Complete one Ensure drink the morning of surgery 3 hours prior to scheduled surgery at 5:30 AM.   Oral Hygiene is also important to reduce your risk of infection.                                    Remember - BRUSH YOUR TEETH THE  MORNING OF SURGERY WITH YOUR REGULAR TOOTHPASTE  Do NOT smoke after Midnight   Take these medicines the morning of surgery with A SIP OF WATER:  None              You may not have any metal on your body including  jewelry, and body piercings             Do not wear lotions, powders, perfumes/cologne, or deodorant             Men may shave face and neck.   Do not bring valuables to the hospital. [place].   Contacts, dentures or bridgework may not be worn into surgery.   Bring small overnight bag day of surgery.    Please read over the following fact sheets you were given: IF YOU HAVE QUESTIONS ABOUT YOUR PRE OP INSTRUCTIONS PLEASE CALL [phone]   [facility] - Preparing for Surgery Before surgery, you can play an important role.  Because skin is not sterile, your skin needs to be as free of germs as possible.  You can reduce the number of germs on your skin by washing with CHG (chlorahexidine gluconate) soap before surgery.  CHG is an antiseptic cleaner which kills germs and bonds with the skin to continue killing germs even after washing. Please DO NOT use if you have an allergy to CHG or antibacterial soaps.  If your skin becomes reddened/irritated stop using the CHG and inform your nurse when you arrive at Short Stay. Do not shave (including legs and underarms) for at least 48 hours prior to the first CHG shower.  You may shave your face/neck.  Please follow these instructions carefully:  1.  Shower with CHG Soap the night before surgery and the  morning of surgery.  2.  If you choose to wash your hair, wash your hair first as usual with your normal  shampoo.  3.  After you shampoo, rinse your hair and body thoroughly to remove the shampoo.                             4.  Use CHG as you would any other liquid soap.  You can apply chg directly to the skin and wash.  Gently with a scrungie or clean washcloth.  5.  Apply the CHG Soap to your  body ONLY FROM THE NECK DOWN.   Do   not use on face/ open                           Wound or open sores. Avoid contact with eyes, ears mouth and   genitals (private parts).                       Wash face,  Genitals (private parts) with your normal soap.             6.  Wash thoroughly, paying special attention to the area where your    surgery  will be performed.  7.  Thoroughly rinse your body with warm water from the neck down.  8.  DO NOT shower/wash with your normal soap after using and rinsing off the CHG Soap.                9.  Pat yourself dry with a clean towel.  10.  Wear clean pajamas.            11.  Place clean sheets on your bed the night of your first shower and do not  sleep with pets. Day of Surgery : Do not apply any lotions/deodorants the morning of surgery.  Please wear clean clothes to the hospital/surgery center.  FAILURE TO FOLLOW THESE INSTRUCTIONS MAY RESULT IN THE CANCELLATION OF YOUR SURGERY  PATIENT SIGNATURE_________________________________  NURSE SIGNATURE__________________________________  ________________________________________________________________________

## 2020-06-16 ENCOUNTER — Encounter (HOSPITAL_COMMUNITY): Payer: Self-pay

## 2020-06-16 ENCOUNTER — Other Ambulatory Visit: Payer: Self-pay

## 2020-06-16 ENCOUNTER — Encounter (HOSPITAL_COMMUNITY)
Admission: RE | Admit: 2020-06-16 | Discharge: 2020-06-16 | Disposition: A | Discharge status: Home / Self Care | Payer: PPO | Source: Ambulatory Visit | Attending: Surgery | Admitting: Surgery

## 2020-06-16 DIAGNOSIS — Z01812 Encounter for preprocedural laboratory examination: Secondary | ICD-10-CM | POA: Insufficient documentation

## 2020-06-16 LAB — CBC
HCT: 45.9 % (ref 39.0–52.0)
Hemoglobin: 15.4 g/dL (ref 13.0–17.0)
MCH: 31 pg (ref 26.0–34.0)
MCHC: 33.6 g/dL (ref 30.0–36.0)
MCV: 92.5 fL (ref 80.0–100.0)
Platelets: 164 10*3/uL (ref 150–400)
RBC: 4.96 MIL/uL (ref 4.22–5.81)
RDW: 12.7 % (ref 11.5–15.5)
WBC: 5.4 10*3/uL (ref 4.0–10.5)
nRBC: 0 % (ref 0.0–0.2)

## 2020-06-20 ENCOUNTER — Other Ambulatory Visit (HOSPITAL_COMMUNITY)
Admission: RE | Admit: 2020-06-20 | Discharge: 2020-06-20 | Disposition: A | Discharge status: Home / Self Care | Payer: PPO | Source: Ambulatory Visit | Attending: Surgery | Admitting: Surgery

## 2020-06-20 DIAGNOSIS — Z20822 Contact with and (suspected) exposure to covid-19: Secondary | ICD-10-CM | POA: Diagnosis not present

## 2020-06-20 DIAGNOSIS — Z01812 Encounter for preprocedural laboratory examination: Secondary | ICD-10-CM | POA: Diagnosis not present

## 2020-06-20 LAB — SARS CORONAVIRUS 2 (TAT 6-24 HRS): SARS Coronavirus 2: NEGATIVE

## 2020-06-23 MED ORDER — BUPIVACAINE LIPOSOME 1.3 % IJ SUSP
20.0000 mL | Freq: Once | INTRAMUSCULAR | Status: DC
  Filled 2020-06-23: qty 20

## 2020-06-23 NOTE — Anesthesia Preprocedure Evaluation (Addendum)
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,  GIST tumor    Endo/Other  negative endocrine ROS  Renal/GU negative Renal ROS     Musculoskeletal negative musculoskeletal ROS (+)   Abdominal   Peds  Hematology  (+) REFUSES BLOOD PRODUCTS, JEHOVAH'S WITNESS  Anesthesia Other Findings Covid test negative   Reproductive/Obstetrics                            Anesthesia Physical Anesthesia Plan  ASA: II  Anesthesia Plan: General   Post-op Pain Management:    Induction: Intravenous  PONV Risk Score and Plan: 3 and Treatment may vary due to age or medical condition, Ondansetron and Dexamethasone  Airway Management Planned: Oral ETT  Additional Equipment: None  Intra-op Plan:   Post-operative Plan: Extubation in OR  Informed Consent: I have reviewed the patients History and Physical, chart, labs and discussed the procedure including the risks, benefits and alternatives for the proposed anesthesia with the patient or authorized representative who has indicated his/her understanding and acceptance.     Dental advisory given  Plan Discussed with: CRNA and Anesthesiologist  Anesthesia Plan Comments:        Anesthesia Quick Evaluation

## 2020-06-24 ENCOUNTER — Inpatient Hospital Stay (HOSPITAL_COMMUNITY)
Admission: RE | Admit: 2020-06-24 | Discharge: 2020-06-25 | DRG: 328 | Disposition: A | Discharge status: Home / Self Care | Payer: PPO | Attending: Surgery | Admitting: Surgery

## 2020-06-24 ENCOUNTER — Inpatient Hospital Stay (HOSPITAL_COMMUNITY): Payer: PPO | Admitting: Anesthesiology

## 2020-06-24 ENCOUNTER — Other Ambulatory Visit: Payer: Self-pay

## 2020-06-24 ENCOUNTER — Encounter (HOSPITAL_COMMUNITY): Payer: Self-pay | Admitting: Surgery

## 2020-06-24 ENCOUNTER — Encounter (HOSPITAL_COMMUNITY)
Admission: RE | Disposition: A | Discharge status: Home / Self Care | Payer: Self-pay | Source: Home / Self Care | Attending: Surgery

## 2020-06-24 DIAGNOSIS — Z531 Procedure and treatment not carried out because of patient's decision for reasons of belief and group pressure: Secondary | ICD-10-CM

## 2020-06-24 DIAGNOSIS — Z85820 Personal history of malignant melanoma of skin: Secondary | ICD-10-CM

## 2020-06-24 DIAGNOSIS — C49A2 Gastrointestinal stromal tumor of stomach: Secondary | ICD-10-CM | POA: Diagnosis not present

## 2020-06-24 DIAGNOSIS — F419 Anxiety disorder, unspecified: Secondary | ICD-10-CM | POA: Diagnosis not present

## 2020-06-24 DIAGNOSIS — K222 Esophageal obstruction: Secondary | ICD-10-CM

## 2020-06-24 DIAGNOSIS — J45909 Unspecified asthma, uncomplicated: Secondary | ICD-10-CM | POA: Diagnosis not present

## 2020-06-24 LAB — CREATININE, SERUM
Creatinine, Ser: 0.84 mg/dL (ref 0.61–1.24)
GFR, Estimated: >60 mL/min (ref >60)

## 2020-06-24 LAB — CBC
HCT: 41.8 % (ref 39.0–52.0)
Hemoglobin: 14.4 g/dL (ref 13.0–17.0)
MCH: 31.6 pg (ref 26.0–34.0)
MCHC: 34.4 g/dL (ref 30.0–36.0)
MCV: 91.7 fL (ref 80.0–100.0)
Platelets: 146 10*3/uL — ABNORMAL LOW (ref 150–400)
RBC: 4.56 MIL/uL (ref 4.22–5.81)
RDW: 12.8 % (ref 11.5–15.5)
WBC: 13.1 10*3/uL — ABNORMAL HIGH (ref 4.0–10.5)
nRBC: 0 % (ref 0.0–0.2)

## 2020-06-24 LAB — NO BLOOD PRODUCTS

## 2020-06-24 SURGERY — FUNDOPLICATION, NISSEN, ROBOT-ASSISTED, LAPAROSCOPIC
Anesthesia: General | Site: Abdomen

## 2020-06-24 MED ORDER — SODIUM CHLORIDE 0.9 % IV SOLN: Freq: Three times a day (TID) | INTRAVENOUS | Status: DC | PRN

## 2020-06-24 MED ORDER — PHENYLEPHRINE 40 MCG/ML (10ML) SYRINGE FOR IV PUSH (FOR BLOOD PRESSURE SUPPORT)
PREFILLED_SYRINGE | INTRAVENOUS | Status: DC | PRN
  Administered 2020-06-24: 80 ug via INTRAVENOUS

## 2020-06-24 MED ORDER — BISACODYL 10 MG RE SUPP: 10.0000 mg | Freq: Every day | RECTAL | Status: DC | PRN

## 2020-06-24 MED ORDER — SPY AGENT GREEN - (INDOCYANINE FOR INJECTION)
INTRAMUSCULAR | Status: DC | PRN
  Administered 2020-06-24: 3 mL via INTRAVENOUS

## 2020-06-24 MED ORDER — SUGAMMADEX SODIUM 200 MG/2ML IV SOLN
INTRAVENOUS | Status: DC | PRN
  Administered 2020-06-24: 200 mg via INTRAVENOUS

## 2020-06-24 MED ORDER — SIMETHICONE 80 MG PO CHEW: 40.0000 mg | CHEWABLE_TABLET | Freq: Four times a day (QID) | ORAL | Status: DC | PRN

## 2020-06-24 MED ORDER — ONDANSETRON HCL 4 MG/2ML IJ SOLN
INTRAMUSCULAR | Status: DC | PRN
  Administered 2020-06-24: 4 mg via INTRAVENOUS

## 2020-06-24 MED ORDER — ENSURE PRE-SURGERY PO LIQD
296.0000 mL | Freq: Once | ORAL | Status: DC
  Filled 2020-06-24: qty 296

## 2020-06-24 MED ORDER — TRAMADOL HCL 50 MG PO TABS: 50.0000 mg | ORAL_TABLET | Freq: Four times a day (QID) | ORAL | Status: DC | PRN

## 2020-06-24 MED ORDER — METHOCARBAMOL 1000 MG/10ML IJ SOLN
1000.0000 mg | Freq: Four times a day (QID) | INTRAVENOUS | Status: DC | PRN
  Filled 2020-06-24: qty 10

## 2020-06-24 MED ORDER — ENSURE PRE-SURGERY PO LIQD
592.0000 mL | Freq: Once | ORAL | Status: DC
  Filled 2020-06-24: qty 592

## 2020-06-24 MED ORDER — KETAMINE HCL 10 MG/ML IJ SOLN
INTRAMUSCULAR | Status: DC | PRN
  Administered 2020-06-24: 10 mg via INTRAVENOUS
  Administered 2020-06-24: 30 mg via INTRAVENOUS

## 2020-06-24 MED ORDER — ACETAMINOPHEN 500 MG PO TABS
1000.0000 mg | ORAL_TABLET | Freq: Four times a day (QID) | ORAL | Status: DC
  Administered 2020-06-24 – 2020-06-25 (×4): 1000 mg via ORAL
  Filled 2020-06-24 (×3): qty 2

## 2020-06-24 MED ORDER — MIDAZOLAM HCL 2 MG/2ML IJ SOLN
INTRAMUSCULAR | Status: AC
  Filled 2020-06-24: qty 2

## 2020-06-24 MED ORDER — ALBUMIN HUMAN 5 % IV SOLN
INTRAVENOUS | Status: AC
  Filled 2020-06-24: qty 750

## 2020-06-24 MED ORDER — LACTATED RINGERS IV SOLN: INTRAVENOUS | Status: DC

## 2020-06-24 MED ORDER — MAGNESIUM HYDROXIDE 400 MG/5ML PO SUSP: 30.0000 mL | Freq: Every day | ORAL | Status: DC | PRN

## 2020-06-24 MED ORDER — PROPOFOL 10 MG/ML IV BOLUS
INTRAVENOUS | Status: DC | PRN
  Administered 2020-06-24: 140 mg via INTRAVENOUS

## 2020-06-24 MED ORDER — BEANO PO TABS: 2.0000 | ORAL_TABLET | Freq: Every day | ORAL | Status: DC

## 2020-06-24 MED ORDER — ENOXAPARIN SODIUM 40 MG/0.4ML ~~LOC~~ SOLN
40.0000 mg | SUBCUTANEOUS | Status: DC
  Administered 2020-06-25: 09:00:00 40 mg via SUBCUTANEOUS
  Filled 2020-06-24: qty 0.4

## 2020-06-24 MED ORDER — FENTANYL CITRATE (PF) 100 MCG/2ML IJ SOLN
INTRAMUSCULAR | Status: AC
  Filled 2020-06-24: qty 2

## 2020-06-24 MED ORDER — VITAMIN D 25 MCG (1000 UNIT) PO TABS
4000.0000 [IU] | ORAL_TABLET | Freq: Every day | ORAL | Status: DC
  Administered 2020-06-24 – 2020-06-25 (×2): 4000 [IU] via ORAL
  Filled 2020-06-24 (×2): qty 4

## 2020-06-24 MED ORDER — PHENYLEPHRINE HCL-NACL 10-0.9 MG/250ML-% IV SOLN
INTRAVENOUS | Status: DC | PRN
  Administered 2020-06-24: 60 ug/min via INTRAVENOUS

## 2020-06-24 MED ORDER — ENALAPRILAT 1.25 MG/ML IV SOLN
0.6250 mg | Freq: Four times a day (QID) | INTRAVENOUS | Status: DC | PRN
  Filled 2020-06-24: qty 1

## 2020-06-24 MED ORDER — ONDANSETRON HCL 4 MG/2ML IJ SOLN: 4.0000 mg | Freq: Once | INTRAMUSCULAR | Status: DC | PRN

## 2020-06-24 MED ORDER — SCOPOLAMINE 1 MG/3DAYS TD PT72
1.0000 | MEDICATED_PATCH | TRANSDERMAL | Status: DC
  Administered 2020-06-24: 1.5 mg via TRANSDERMAL
  Filled 2020-06-24: qty 1

## 2020-06-24 MED ORDER — POLYETHYLENE GLYCOL 3350 17 G PO PACK
17.0000 g | PACK | Freq: Every day | ORAL | Status: DC
  Administered 2020-06-24 – 2020-06-25 (×2): 17 g via ORAL
  Filled 2020-06-24 (×2): qty 1

## 2020-06-24 MED ORDER — DIPHENHYDRAMINE HCL 50 MG/ML IJ SOLN: 12.5000 mg | Freq: Four times a day (QID) | INTRAMUSCULAR | Status: DC | PRN

## 2020-06-24 MED ORDER — DIPHENHYDRAMINE HCL 12.5 MG/5ML PO ELIX: 12.5000 mg | ORAL_SOLUTION | Freq: Four times a day (QID) | ORAL | Status: DC | PRN

## 2020-06-24 MED ORDER — METOCLOPRAMIDE HCL 5 MG/ML IJ SOLN: 5.0000 mg | Freq: Three times a day (TID) | INTRAMUSCULAR | Status: DC | PRN

## 2020-06-24 MED ORDER — LIDOCAINE 20MG/ML (2%) 15 ML SYRINGE OPTIME
INTRAMUSCULAR | Status: DC | PRN
  Administered 2020-06-24: 1.5 mg/kg/h via INTRAVENOUS

## 2020-06-24 MED ORDER — LACTATED RINGERS IR SOLN
Status: DC | PRN
  Administered 2020-06-24: 1000 mL

## 2020-06-24 MED ORDER — DEXAMETHASONE SODIUM PHOSPHATE 10 MG/ML IJ SOLN
INTRAMUSCULAR | Status: DC | PRN
  Administered 2020-06-24: 10 mg via INTRAVENOUS

## 2020-06-24 MED ORDER — ROCURONIUM BROMIDE 10 MG/ML (PF) SYRINGE
PREFILLED_SYRINGE | INTRAVENOUS | Status: DC | PRN
  Administered 2020-06-24: 60 mg via INTRAVENOUS
  Administered 2020-06-24: 20 mg via INTRAVENOUS

## 2020-06-24 MED ORDER — FENTANYL CITRATE (PF) 100 MCG/2ML IJ SOLN
25.0000 ug | INTRAMUSCULAR | Status: DC | PRN
  Administered 2020-06-24 (×3): 50 ug via INTRAVENOUS

## 2020-06-24 MED ORDER — METOPROLOL TARTRATE 5 MG/5ML IV SOLN: 5.0000 mg | Freq: Four times a day (QID) | INTRAVENOUS | Status: DC | PRN

## 2020-06-24 MED ORDER — LIP MEDEX EX OINT
1.0000 "application " | TOPICAL_OINTMENT | Freq: Two times a day (BID) | CUTANEOUS | Status: DC
  Administered 2020-06-24 – 2020-06-25 (×3): 1 via TOPICAL
  Filled 2020-06-24: qty 7

## 2020-06-24 MED ORDER — OXYCODONE HCL 5 MG/5ML PO SOLN: 5.0000 mg | Freq: Once | ORAL | Status: DC | PRN

## 2020-06-24 MED ORDER — PHENYLEPHRINE 40 MCG/ML (10ML) SYRINGE FOR IV PUSH (FOR BLOOD PRESSURE SUPPORT)
PREFILLED_SYRINGE | INTRAVENOUS | Status: AC
  Filled 2020-06-24: qty 10

## 2020-06-24 MED ORDER — LIDOCAINE 2% (20 MG/ML) 5 ML SYRINGE
INTRAMUSCULAR | Status: AC
  Filled 2020-06-24: qty 5

## 2020-06-24 MED ORDER — GABAPENTIN 300 MG PO CAPS
300.0000 mg | ORAL_CAPSULE | ORAL | Status: AC
  Administered 2020-06-24: 300 mg via ORAL
  Filled 2020-06-24: qty 1

## 2020-06-24 MED ORDER — EPHEDRINE 5 MG/ML INJ
INTRAVENOUS | Status: AC
  Filled 2020-06-24: qty 10

## 2020-06-24 MED ORDER — CHLORHEXIDINE GLUCONATE CLOTH 2 % EX PADS: 6.0000 | MEDICATED_PAD | Freq: Once | CUTANEOUS | Status: DC

## 2020-06-24 MED ORDER — EPHEDRINE SULFATE-NACL 50-0.9 MG/10ML-% IV SOSY
PREFILLED_SYRINGE | INTRAVENOUS | Status: DC | PRN
  Administered 2020-06-24: 25 mg via INTRAVENOUS

## 2020-06-24 MED ORDER — FENTANYL CITRATE (PF) 250 MCG/5ML IJ SOLN
INTRAMUSCULAR | Status: AC
  Filled 2020-06-24: qty 5

## 2020-06-24 MED ORDER — ACETAMINOPHEN 500 MG PO TABS
1000.0000 mg | ORAL_TABLET | ORAL | Status: AC
  Administered 2020-06-24: 1000 mg via ORAL
  Filled 2020-06-24: qty 2

## 2020-06-24 MED ORDER — MIDAZOLAM HCL 5 MG/5ML IJ SOLN
INTRAMUSCULAR | Status: DC | PRN
  Administered 2020-06-24: 2 mg via INTRAVENOUS

## 2020-06-24 MED ORDER — TRAMADOL HCL 50 MG PO TABS
50.0000 mg | ORAL_TABLET | Freq: Four times a day (QID) | ORAL | 0 refills | Status: DC | PRN
Start: 2020-06-24 — End: 2021-07-14

## 2020-06-24 MED ORDER — CALCIUM CARBONATE 1250 (500 CA) MG PO TABS
1.0000 | ORAL_TABLET | Freq: Every day | ORAL | Status: DC
  Administered 2020-06-25: 09:00:00 500 mg via ORAL
  Filled 2020-06-24: qty 1

## 2020-06-24 MED ORDER — GABAPENTIN 300 MG PO CAPS
300.0000 mg | ORAL_CAPSULE | Freq: Two times a day (BID) | ORAL | Status: DC
  Administered 2020-06-24 – 2020-06-25 (×2): 300 mg via ORAL
  Filled 2020-06-24 (×2): qty 1

## 2020-06-24 MED ORDER — LIDOCAINE 2% (20 MG/ML) 5 ML SYRINGE
INTRAMUSCULAR | Status: DC | PRN
  Administered 2020-06-24: 60 mg via INTRAVENOUS

## 2020-06-24 MED ORDER — METHOCARBAMOL 500 MG PO TABS: 500.0000 mg | ORAL_TABLET | Freq: Four times a day (QID) | ORAL | Status: DC | PRN

## 2020-06-24 MED ORDER — MAGIC MOUTHWASH
15.0000 mL | Freq: Four times a day (QID) | ORAL | Status: DC | PRN
  Filled 2020-06-24: qty 15

## 2020-06-24 MED ORDER — ONDANSETRON HCL 4 MG/2ML IJ SOLN
INTRAMUSCULAR | Status: AC
  Filled 2020-06-24: qty 2

## 2020-06-24 MED ORDER — BUPIVACAINE LIPOSOME 1.3 % IJ SUSP
INTRAMUSCULAR | Status: DC | PRN
  Administered 2020-06-24: 20 mL

## 2020-06-24 MED ORDER — SODIUM CHLORIDE 0.9 % IV SOLN
2.0000 g | INTRAVENOUS | Status: AC
  Administered 2020-06-24: 2 g via INTRAVENOUS
  Filled 2020-06-24: qty 20

## 2020-06-24 MED ORDER — PHENYLEPHRINE HCL (PRESSORS) 10 MG/ML IV SOLN
INTRAVENOUS | Status: AC
  Filled 2020-06-24: qty 1

## 2020-06-24 MED ORDER — PROPOFOL 10 MG/ML IV BOLUS
INTRAVENOUS | Status: AC
  Filled 2020-06-24: qty 20

## 2020-06-24 MED ORDER — KETAMINE HCL 10 MG/ML IJ SOLN
INTRAMUSCULAR | Status: AC
  Filled 2020-06-24: qty 1

## 2020-06-24 MED ORDER — SODIUM CHLORIDE 0.9% FLUSH: 3.0000 mL | INTRAVENOUS | Status: DC | PRN

## 2020-06-24 MED ORDER — SODIUM CHLORIDE 0.9 % IV SOLN: 250.0000 mL | INTRAVENOUS | Status: DC | PRN

## 2020-06-24 MED ORDER — BUPIVACAINE-EPINEPHRINE (PF) 0.25% -1:200000 IJ SOLN
INTRAMUSCULAR | Status: AC
  Filled 2020-06-24: qty 60

## 2020-06-24 MED ORDER — PROCHLORPERAZINE EDISYLATE 10 MG/2ML IJ SOLN: 5.0000 mg | INTRAMUSCULAR | Status: DC | PRN

## 2020-06-24 MED ORDER — OXYCODONE HCL 5 MG PO TABS: 5.0000 mg | ORAL_TABLET | ORAL | Status: DC | PRN

## 2020-06-24 MED ORDER — DEXAMETHASONE SODIUM PHOSPHATE 10 MG/ML IJ SOLN
INTRAMUSCULAR | Status: AC
  Filled 2020-06-24: qty 1

## 2020-06-24 MED ORDER — FENTANYL CITRATE (PF) 250 MCG/5ML IJ SOLN
INTRAMUSCULAR | Status: DC | PRN
Start: 2020-06-24 — End: 2020-06-24
  Administered 2020-06-24: 50 ug via INTRAVENOUS
  Administered 2020-06-24: 100 ug via INTRAVENOUS

## 2020-06-24 MED ORDER — LIDOCAINE HCL 2 % IJ SOLN
INTRAMUSCULAR | Status: AC
  Filled 2020-06-24: qty 20

## 2020-06-24 MED ORDER — SODIUM CHLORIDE 0.9% FLUSH
3.0000 mL | Freq: Two times a day (BID) | INTRAVENOUS | Status: DC
  Administered 2020-06-24: 3 mL via INTRAVENOUS

## 2020-06-24 MED ORDER — ALBUMIN HUMAN 5 % IV SOLN: INTRAVENOUS | Status: DC | PRN

## 2020-06-24 MED ORDER — ROCURONIUM BROMIDE 10 MG/ML (PF) SYRINGE
PREFILLED_SYRINGE | INTRAVENOUS | Status: AC
  Filled 2020-06-24: qty 10

## 2020-06-24 MED ORDER — CHLORHEXIDINE GLUCONATE 0.12 % MT SOLN
15.0000 mL | Freq: Once | OROMUCOSAL | Status: AC
  Administered 2020-06-24: 15 mL via OROMUCOSAL

## 2020-06-24 MED ORDER — BUPIVACAINE-EPINEPHRINE 0.25% -1:200000 IJ SOLN
INTRAMUSCULAR | Status: DC | PRN
  Administered 2020-06-24: 60 mL

## 2020-06-24 MED ORDER — DEXAMETHASONE SODIUM PHOSPHATE 4 MG/ML IJ SOLN: 4.0000 mg | INTRAMUSCULAR | Status: DC

## 2020-06-24 MED ORDER — OXYCODONE HCL 5 MG PO TABS: 5.0000 mg | ORAL_TABLET | Freq: Once | ORAL | Status: DC | PRN

## 2020-06-24 MED ORDER — ORAL CARE MOUTH RINSE: 15.0000 mL | Freq: Once | OROMUCOSAL | Status: AC

## 2020-06-24 MED ORDER — METRONIDAZOLE IN NACL 5-0.79 MG/ML-% IV SOLN
500.0000 mg | INTRAVENOUS | Status: AC
  Administered 2020-06-24: 500 mg via INTRAVENOUS
  Filled 2020-06-24: qty 100

## 2020-06-24 MED ORDER — 0.9 % SODIUM CHLORIDE (POUR BTL) OPTIME
TOPICAL | Status: DC | PRN
  Administered 2020-06-24: 1000 mL

## 2020-06-24 SURGICAL SUPPLY — 83 items
APL PRP STRL LF DISP 70% ISPRP (MISCELLANEOUS) ×2
APL SWBSTK 6 STRL LF DISP (MISCELLANEOUS)
APPLICATOR COTTON TIP 6 STRL (MISCELLANEOUS) ×1 IMPLANT
APPLICATOR COTTON TIP 6IN STRL (MISCELLANEOUS)
APPLIER CLIP 5 13 M/L LIGAMAX5 (MISCELLANEOUS)
APR CLP MED LRG 5 ANG JAW (MISCELLANEOUS)
BLADE SURG SZ11 CARB STEEL (BLADE) ×3 IMPLANT
CANNULA REDUC XI 12-8 STAPL (CANNULA) ×3
CANNULA REDUCER 12-8 DVNC XI (CANNULA) ×1 IMPLANT
CELLS DAT CNTRL 66122 CELL SVR (MISCELLANEOUS) ×2 IMPLANT
CHLORAPREP W/TINT 26 (MISCELLANEOUS) ×3 IMPLANT
CLIP APPLIE 5 13 M/L LIGAMAX5 (MISCELLANEOUS) IMPLANT
COVER SURGICAL LIGHT HANDLE (MISCELLANEOUS) ×3 IMPLANT
COVER TIP SHEARS 8 DVNC (MISCELLANEOUS) ×1 IMPLANT
COVER TIP SHEARS 8MM DA VINCI (MISCELLANEOUS) ×3
COVER WAND RF STERILE (DRAPES) ×3 IMPLANT
DECANTER SPIKE VIAL GLASS SM (MISCELLANEOUS) ×5 IMPLANT
DRAIN CHANNEL 19F RND (DRAIN) IMPLANT
DRAIN PENROSE 0.5X18 (DRAIN) IMPLANT
DRAPE ARM DVNC X/XI (DISPOSABLE) ×8 IMPLANT
DRAPE COLUMN DVNC XI (DISPOSABLE) ×2 IMPLANT
DRAPE DA VINCI XI ARM (DISPOSABLE) ×12
DRAPE DA VINCI XI COLUMN (DISPOSABLE) ×3
DRAPE WARM FLUID 44X44 (DRAPES) ×3 IMPLANT
DRSG OPSITE POSTOP 4X6 (GAUZE/BANDAGES/DRESSINGS) ×2 IMPLANT
DRSG TEGADERM 2-3/8X2-3/4 SM (GAUZE/BANDAGES/DRESSINGS) ×13 IMPLANT
ELECT PENCIL ROCKER SW 15FT (MISCELLANEOUS) ×2 IMPLANT
ELECT REM PT RETURN 15FT ADLT (MISCELLANEOUS) ×3 IMPLANT
ENDOLOOP SUT PDS II  0 18 (SUTURE)
ENDOLOOP SUT PDS II 0 18 (SUTURE) IMPLANT
EVACUATOR DRAINAGE 10X20 100CC (DRAIN) IMPLANT
EVACUATOR SILICONE 100CC (DRAIN) IMPLANT
FILTER SMOKE EVAC (FILTER) ×2 IMPLANT
GAUZE SPONGE 2X2 8PLY STRL LF (GAUZE/BANDAGES/DRESSINGS) ×2 IMPLANT
GLOVE ECLIPSE 8.0 STRL XLNG CF (GLOVE) ×6 IMPLANT
GLOVE INDICATOR 8.0 STRL GRN (GLOVE) ×6 IMPLANT
GOWN STRL REUS W/TWL XL LVL3 (GOWN DISPOSABLE) ×9 IMPLANT
GRASPER SUT TROCAR 14GX15 (MISCELLANEOUS) IMPLANT
IRRIG SUCT STRYKERFLOW 2 WTIP (MISCELLANEOUS) ×3
IRRIGATION SUCT STRKRFLW 2 WTP (MISCELLANEOUS) ×2 IMPLANT
KIT BASIN OR (CUSTOM PROCEDURE TRAY) ×3 IMPLANT
KIT PROCEDURE DA VINCI SI (MISCELLANEOUS) ×3
KIT PROCEDURE DVNC SI (MISCELLANEOUS) ×1 IMPLANT
KIT TURNOVER KIT A (KITS) IMPLANT
NDL INSUFFLATION 14GA 120MM (NEEDLE) IMPLANT
NEEDLE HYPO 22GX1.5 SAFETY (NEEDLE) ×3 IMPLANT
NEEDLE INSUFFLATION 14GA 120MM (NEEDLE) ×3 IMPLANT
PACK CARDIOVASCULAR III (CUSTOM PROCEDURE TRAY) ×3 IMPLANT
PAD POSITIONING PINK XL (MISCELLANEOUS) ×3 IMPLANT
PENCIL SMOKE EVACUATOR (MISCELLANEOUS) IMPLANT
RELOAD STAPLE 60 4.3 GRN DVNC (STAPLE) IMPLANT
RELOAD STAPLER 4.3X60 GRN DVNC (STAPLE) ×6 IMPLANT
RETRACTOR WND ALEXIS 18 MED (MISCELLANEOUS) IMPLANT
RTRCTR WOUND ALEXIS 18CM MED (MISCELLANEOUS) ×3
SCISSORS LAP 5X45 EPIX DISP (ENDOMECHANICALS) IMPLANT
SEAL CANN UNIV 5-8 DVNC XI (MISCELLANEOUS) ×8 IMPLANT
SEAL XI 5MM-8MM UNIVERSAL (MISCELLANEOUS) ×12
SEALER VESSEL DA VINCI XI (MISCELLANEOUS) ×3
SEALER VESSEL EXT DVNC XI (MISCELLANEOUS) ×2 IMPLANT
SOLUTION ELECTROLUBE (MISCELLANEOUS) ×3 IMPLANT
SPONGE GAUZE 2X2 STER 10/PKG (GAUZE/BANDAGES/DRESSINGS) ×1
SPONGE LAP 18X18 RF (DISPOSABLE) ×3 IMPLANT
STAPLER 60 DA VINCI SURE FORM (STAPLE) ×3
STAPLER 60 SUREFORM DVNC (STAPLE) ×1 IMPLANT
STAPLER CANNULA SEAL DVNC XI (STAPLE) ×1 IMPLANT
STAPLER CANNULA SEAL XI (STAPLE) ×3
STAPLER RELOAD 4.3X60 GREEN (STAPLE) ×9
STAPLER RELOAD 4.3X60 GRN DVNC (STAPLE) ×6
STOPCOCK 4 WAY LG BORE MALE ST (IV SETS) ×6 IMPLANT
SUT ETHIBOND 0 36 GRN (SUTURE) ×2 IMPLANT
SUT ETHIBOND NAB CT1 #1 30IN (SUTURE) ×3 IMPLANT
SUT MNCRL AB 4-0 PS2 18 (SUTURE) ×3 IMPLANT
SUT PDS AB 1 CT1 27 (SUTURE) ×4 IMPLANT
SUT PROLENE 2 0 SH DA (SUTURE) IMPLANT
SUT VICRYL 0 TIES 12 18 (SUTURE) IMPLANT
SUT VICRYL 0 UR6 27IN ABS (SUTURE) ×2 IMPLANT
SUT VLOC 180 2-0 9IN GS21 (SUTURE) IMPLANT
SYR 10ML LL (SYRINGE) ×3 IMPLANT
SYR 20ML LL LF (SYRINGE) ×3 IMPLANT
TOWEL OR 17X26 10 PK STRL BLUE (TOWEL DISPOSABLE) ×3 IMPLANT
TOWEL OR NON WOVEN STRL DISP B (DISPOSABLE) ×3 IMPLANT
TROCAR ADV FIXATION 5X100MM (TROCAR) ×3 IMPLANT
TUBING INSUFFLATION 10FT LAP (TUBING) ×3 IMPLANT

## 2020-06-24 NOTE — Discharge Instructions (Signed)
EATING AFTER YOUR ESOPHAGEAL SURGERY (Stomach Fundoplication, Hiatal Hernia repair, Achalasia surgery, etc)  ######################################################################  EAT Start with a pureed / full liquid diet (see below) Gradually transition to a high fiber diet with a fiber supplement over the next month after discharge.    WALK Walk an hour a day.  Control your pain to do that.    CONTROL PAIN Control pain so that you can walk, sleep, tolerate sneezing/coughing, go up/down stairs.  HAVE A BOWEL MOVEMENT DAILY Keep your bowels regular to avoid problems.  OK to try a laxative to override constipation.  OK to use an antidairrheal to slow down diarrhea.  Call if not better after 2 tries  CALL IF YOU HAVE PROBLEMS/CONCERNS Call if you are still struggling despite following these instructions. Call if you have concerns not answered by these instructions  ######################################################################   After your esophageal surgery, expect some sticking with swallowing over the next 1-2 months.    If food sticks when you eat, it is called "dysphagia".  This is due to swelling around your esophagus at the wrap & hiatal diaphragm repair.  It will gradually ease off over the next few months.  To help you through this temporary phase, we start you out on a pureed (blenderized) diet.  Your first meal in the hospital was thin liquids.  You should have been given a pureed diet by the time you left the hospital.  We ask patients to stay on a pureed diet for the first 2-3 weeks to avoid anything getting "stuck" near your recent surgery.  Don't be alarmed if your ability to swallow doesn't progress according to this plan.  Everyone is different and some diets can advance more or less quickly.    It is often helpful to crush your medications or split them as they can sometimes stick, especially the first week or so.   Some BASIC RULES to follow  are:  Maintain an upright position whenever eating or drinking.  Take small bites - just a teaspoon size bite at a time.  Eat slowly.  It may also help to eat only one food at a time.  Consider nibbling through smaller, more frequent meals & avoid the urge to eat BIG meals  Do not push through feelings of fullness, nausea, or bloatedness  Do not mix solid foods and liquids in the same mouthful  Try not to "wash foods down" with large gulps of liquids.  Avoid carbonated (bubbly/fizzy) drinks.    Avoid foods that make you feel gassy or bloated.  Start with bland foods first.  Wait on trying greasy, fried, or spicy meals until you are tolerating more bland solids well.  Understand that it will be hard to burp and belch at first.  This gradually improves with time.  Expect to be more gassy/flatulent/bloated initially.  Walking will help your body manage it better.  Consider using medications for bloating that contain simethicone such as  Maalox or Gas-X   Consider crushing her medications, especially smaller pills.  The ability to swallow pills should get easier after a few weeks  Eat in a relaxed atmosphere & minimize distractions.  Avoid talking while eating.    Do not use straws.  Following each meal, sit in an upright position (90 degree angle) for 60 to 90 minutes.  Going for a short walk can help as well  If food does stick, don't panic.  Try to relax and let the food pass on its own.    Sipping WARM LIQUID such as strong hot black tea can also help slide it down.   Be gradual in changes & use common sense:  -If you easily tolerating a certain "level" of foods, advance to the next level gradually -If you are having trouble swallowing a particular food, then avoid it.   -If food is sticking when you advance your diet, go back to thinner previous diet (the lower LEVEL) for 1-2 days.  LEVEL 1 = PUREED DIET  Do for the first 2 WEEKS AFTER SURGERY  -Foods in this group are  pureed or blenderized to a smooth, mashed potato-like consistency.  -If necessary, the pureed foods can keep their shape with the addition of a thickening agent.   -Meat should be pureed to a smooth, pasty consistency.  Hot broth or gravy may be added to the pureed meat, approximately 1 oz. of liquid per 3 oz. serving of meat. -CAUTION:  If any foods do not puree into a smooth consistency, swallowing will be more difficult.  (For example, nuts or seeds sometimes do not blend well.)  Hot Foods Cold Foods  Pureed scrambled eggs and cheese Pureed cottage cheese  Baby cereals Thickened juices and nectars  Thinned cooked cereals (no lumps) Thickened milk or eggnog  Pureed French toast or pancakes Ensure  Mashed potatoes Ice cream  Pureed parsley, au gratin, scalloped potatoes, candied sweet potatoes Fruit or Italian ice, sherbet  Pureed buttered or alfredo noodles Plain yogurt  Pureed vegetables (no corn or peas) Instant breakfast  Pureed soups and creamed soups Smooth pudding, mousse, custard  Pureed scalloped apples Whipped gelatin  Gravies Sugar, syrup, honey, jelly  Sauces, cheese, tomato, barbecue, white, creamed Cream  Any baby food Creamer  Alcohol in moderation (not beer or champagne) Margarine  Coffee or tea Mayonnaise   Ketchup, mustard   Apple sauce   SAMPLE MENU:  PUREED DIET Breakfast Lunch Dinner   Orange juice, 1/2 cup  Cream of wheat, 1/2 cup  Pineapple juice, 1/2 cup  Pureed turkey, barley soup, 3/4 cup  Pureed Hawaiian chicken, 3 oz   Scrambled eggs, mashed or blended with cheese, 1/2 cup  Tea or coffee, 1 cup   Whole milk, 1 cup   Non-dairy creamer, 2 Tbsp.  Mashed potatoes, 1/2 cup  Pureed cooled broccoli, 1/2 cup  Apple sauce, 1/2 cup  Coffee or tea  Mashed potatoes, 1/2 cup  Pureed spinach, 1/2 cup  Frozen yogurt, 1/2 cup  Tea or coffee      LEVEL 2 = SOFT DIET  After your first 2 weeks, you can advance to a soft diet.   Keep on this  diet until everything goes down easily.  Hot Foods Cold Foods  White fish Cottage cheese  Stuffed fish Junior baby fruit  Baby food meals Semi thickened juices  Minced soft cooked, scrambled, poached eggs nectars  Souffle & omelets Ripe mashed bananas  Cooked cereals Canned fruit, pineapple sauce, milk  potatoes Milkshake  Buttered or Alfredo noodles Custard  Cooked cooled vegetable Puddings, including tapioca  Sherbet Yogurt  Vegetable soup or alphabet soup Fruit ice, Italian ice  Gravies Whipped gelatin  Sugar, syrup, honey, jelly Junior baby desserts  Sauces:  Cheese, creamed, barbecue, tomato, white Cream  Coffee or tea Margarine   SAMPLE MENU:  LEVEL 2 Breakfast Lunch Dinner   Orange juice, 1/2 cup  Oatmeal, 1/2 cup  Scrambled eggs with cheese, 1/2 cup  Decaffeinated tea, 1 cup  Whole milk, 1 cup    Non-dairy creamer, 2 Tbsp  Pineapple juice, 1/2 cup  Minced beef, 3 oz  Gravy, 2 Tbsp  Mashed potatoes, 1/2 cup  Minced fresh broccoli, 1/2 cup  Applesauce, 1/2 cup  Coffee, 1 cup  Turkey, barley soup, 3/4 cup  Minced Hawaiian chicken, 3 oz  Mashed potatoes, 1/2 cup  Cooked spinach, 1/2 cup  Frozen yogurt, 1/2 cup  Non-dairy creamer, 2 Tbsp      LEVEL 3 = CHOPPED DIET  -After all the foods in level 2 (soft diet) are passing through well you should advance up to more chopped foods.  -It is still important to cut these foods into small pieces and eat slowly.  Hot Foods Cold Foods  Poultry Cottage cheese  Chopped Swedish meatballs Yogurt  Meat salads (ground or flaked meat) Milk  Flaked fish (tuna) Milkshakes  Poached or scrambled eggs Soft, cold, dry cereal  Souffles and omelets Fruit juices or nectars  Cooked cereals Chopped canned fruit  Chopped French toast or pancakes Canned fruit cocktail  Noodles or pasta (no rice) Pudding, mousse, custard  Cooked vegetables (no frozen peas, corn, or mixed vegetables) Green salad  Canned small sweet peas  Ice cream  Creamed soup or vegetable soup Fruit ice, Italian ice  Pureed vegetable soup or alphabet soup Non-dairy creamer  Ground scalloped apples Margarine  Gravies Mayonnaise  Sauces:  Cheese, creamed, barbecue, tomato, white Ketchup  Coffee or tea Mustard   SAMPLE MENU:  LEVEL 3 Breakfast Lunch Dinner   Orange juice, 1/2 cup  Oatmeal, 1/2 cup  Scrambled eggs with cheese, 1/2 cup  Decaffeinated tea, 1 cup  Whole milk, 1 cup  Non-dairy creamer, 2 Tbsp  Ketchup, 1 Tbsp  Margarine, 1 tsp  Salt, 1/4 tsp  Sugar, 2 tsp  Pineapple juice, 1/2 cup  Ground beef, 3 oz  Gravy, 2 Tbsp  Mashed potatoes, 1/2 cup  Cooked spinach, 1/2 cup  Applesauce, 1/2 cup  Decaffeinated coffee  Whole milk  Non-dairy creamer, 2 Tbsp  Margarine, 1 tsp  Salt, 1/4 tsp  Pureed turkey, barley soup, 3/4 cup  Barbecue chicken, 3 oz  Mashed potatoes, 1/2 cup  Ground fresh broccoli, 1/2 cup  Frozen yogurt, 1/2 cup  Decaffeinated tea, 1 cup  Non-dairy creamer, 2 Tbsp  Margarine, 1 tsp  Salt, 1/4 tsp  Sugar, 1 tsp    LEVEL 4:  REGULAR FOODS  -Foods in this group are soft, moist, regularly textured foods.   -This level includes meat and breads, which tend to be the hardest things to swallow.   -Eat very slowly, chew well and continue to avoid carbonated drinks. -most people are at this level in 4-6 weeks  Hot Foods Cold Foods  Baked fish or skinned Soft cheeses - cottage cheese  Souffles and omelets Cream cheese  Eggs Yogurt  Stuffed shells Milk  Spaghetti with meat sauce Milkshakes  Cooked cereal Cold dry cereals (no nuts, dried fruit, coconut)  French toast or pancakes Crackers  Buttered toast Fruit juices or nectars  Noodles or pasta (no rice) Canned fruit  Potatoes (all types) Ripe bananas  Soft, cooked vegetables (no corn, lima, or baked beans) Peeled, ripe, fresh fruit  Creamed soups or vegetable soup Cakes (no nuts, dried fruit, coconut)  Canned chicken  noodle soup Plain doughnuts  Gravies Ice cream  Bacon dressing Pudding, mousse, custard  Sauces:  Cheese, creamed, barbecue, tomato, white Fruit ice, Italian ice, sherbet  Decaffeinated tea or coffee Whipped gelatin  Pork chops Regular gelatin     Canned fruited gelatin molds   Sugar, syrup, honey, jam, jelly   Cream   Non-dairy   Margarine   Oil   Mayonnaise   Ketchup   Mustard   TROUBLESHOOTING IRREGULAR BOWELS  1) Avoid extremes of bowel movements (no bad constipation/diarrhea)  2) Miralax 17gm mixed in 8oz. water or juice-daily. May use BID as needed.  3) Gas-x,Phazyme, etc. as needed for gas & bloating.  4) Soft,bland diet. No spicy,greasy,fried foods.  5) Prilosec over-the-counter as needed  6) May hold gluten/wheat products from diet to see if symptoms improve.  7) May try probiotics (Align, Activa, etc) to help calm the bowels down  7) If symptoms become worse call back immediately.    If you have any questions please call our office at [place]: [phone].   LAPAROSCOPIC SURGERY: POST OP INSTRUCTIONS  ######################################################################  EAT Gradually transition to a high fiber diet with a fiber supplement over the next few weeks after discharge.  Start with a pureed / full liquid diet (see below)  WALK Walk an hour a day.  Control your pain to do that.    CONTROL PAIN Control pain so that you can walk, sleep, tolerate sneezing/coughing, go up/down stairs.  HAVE A BOWEL MOVEMENT DAILY Keep your bowels regular to avoid problems.  OK to try a laxative to override constipation.  OK to use an antidairrheal to slow down diarrhea.  Call if not better after 2 tries  CALL IF YOU HAVE PROBLEMS/CONCERNS Call if you are still struggling despite following these instructions. Call if you have concerns not answered by these  instructions  ######################################################################    1. DIET: Follow a light bland diet & liquids the first 24 hours after arrival home, such as soup, liquids, starches, etc.  Be sure to drink plenty of fluids.  Quickly advance to a usual solid diet within a few days.  Avoid fast food or heavy meals as your are more likely to get nauseated or have irregular bowels.  A low-fat, high-fiber diet for the rest of your life is ideal.  2. Take your usually prescribed home medications unless otherwise directed.  3. PAIN CONTROL: a. Pain is best controlled by a usual combination of three different methods TOGETHER: i. Ice/Heat ii. Over the counter pain medication iii. Prescription pain medication b. Most patients will experience some swelling and bruising around the incisions.  Ice packs or heating pads (30-60 minutes up to 6 times a day) will help. Use ice for the first few days to help decrease swelling and bruising, then switch to heat to help relax tight/sore spots and speed recovery.  Some people prefer to use ice alone, heat alone, alternating between ice & heat.  Experiment to what works for you.  Swelling and bruising can take several weeks to resolve.   c. It is helpful to take an over-the-counter pain medication regularly for the first few weeks.  Choose one of the following that works best for you: i. Naproxen (Aleve, etc)  Two 220mg  tabs twice a day ii. Ibuprofen (Advil, etc) Three 200mg  tabs four times a day (every meal & bedtime) iii. Acetaminophen (Tylenol, etc) 500-650mg  four times a day (every meal & bedtime) d. A  prescription for pain medication (such as oxycodone, hydrocodone, tramadol, gabapentin, methocarbamol, etc) should be given to you upon discharge.  Take your pain medication as prescribed.  i. If you are having problems/concerns with the prescription medicine (does not control pain, nausea, vomiting, rash, itching, etc), please  call us [phone]  [phone] to see if we need to switch you to a different pain medicine that will work better for you and/or control your side effect better. ii. If you need a refill on your pain medication, please give [place] 48 hour notice.  contact your pharmacy.  They will contact our office to request authorization. Prescriptions will not be filled after 5 pm or on week-ends  4. Avoid getting constipated.   a. Between the surgery and the pain medications, it is common to experience some constipation.   b. Increasing fluid intake and taking a fiber supplement (such as Metamucil, Citrucel, FiberCon, MiraLax, etc) 1-2 times a day regularly will usually help prevent this problem from occurring.   c. A mild laxative (prune juice, Milk of Magnesia, MiraLax, etc) should be taken according to package directions if there are no bowel movements after 48 hours.   5. Watch out for diarrhea.   a. If you have many loose bowel movements, simplify your diet to bland foods & liquids for a few days.   b. Stop any stool softeners and decrease your fiber supplement.   c. Switching to mild anti-diarrheal medications (Kayopectate, Pepto Bismol) can help.   d. If this worsens or does not improve, please call us.  6. Wash / shower every day.  You may shower over the dressings as they are waterproof.  Continue to shower over incision(s) after the dressing is off.  7. Remove your waterproof bandages 3 days after surgery.  You may leave the incision open to air.  You may replace a dressing/Band-Aid to cover the incision for comfort if you wish.   8. ACTIVITIES as tolerated:   i. You may resume regular (light) daily activities beginning the next day--such as daily self-care, walking, climbing stairs--gradually increasing activities as tolerated.  If you can walk 30 minutes without difficulty, it is safe to try more intense activity such as jogging, treadmill, bicycling, low-impact aerobics, swimming, etc. ii. Save the most intensive and  strenuous activity for last such as sit-ups, heavy lifting, contact sports, etc  Refrain from any heavy lifting or straining until you are off narcotics for pain control.   iii. DO NOT PUSH THROUGH PAIN.  Let pain be your guide: If it hurts to do something, don't do it.  Pain is your body warning you to avoid that activity for another week until the pain goes down. iv. You may drive when you are no longer taking prescription pain medication, you can comfortably wear a seatbelt, and you can safely maneuver your car and apply brakes. v. You may have sexual intercourse when it is comfortable.  9. FOLLOW UP in our office a. Please call [facility] at [phone] to set up an appointment to see your surgeon in the office for a follow-up appointment approximately 2-3 weeks after your surgery. b. Make sure that you call for this appointment the day you arrive home to insure a convenient appointment time.  10. IF YOU HAVE DISABILITY OR FAMILY LEAVE FORMS, BRING THEM TO THE OFFICE FOR PROCESSING.  DO NOT GIVE THEM TO YOUR DOCTOR.   WHEN TO CALL us [phone]: 1. Poor pain control 2. Reactions / problems with new medications (rash/itching, nausea, etc)  3. Fever over 101.5 F (38.5 C) 4. Inability to urinate 5. Nausea and/or vomiting 6. Worsening swelling or bruising 7. Continued bleeding from incision. 8. Increased pain, redness, or drainage from the incision   The clinic staff is available to  answer your questions during regular business hours (8:30am-5pm).  Please don't hesitate to call and ask to speak to one of our nurses for clinical concerns.   If you have a medical emergency, go to the nearest emergency room or call 911.  A surgeon from [facility] is always on call at the [facility], [place], [place], [place], [place]  [address] ? MAIN: [phone] ? TOLL FREE: [phone] ?  FAX [phone]  [phone] [email or web address]

## 2020-06-24 NOTE — Transfer of Care (Signed)
Immediate Anesthesia Transfer of Care Note  Patient: Cory Thomas  Procedure(s) Performed: ROBOTIC PARTIAL GASTRECTOMY, BILATERAL TAP BLOCK (N/A Abdomen)  Patient Location: PACU  Anesthesia Type:General  Level of Consciousness: sedated  Airway & Oxygen Therapy: Patient Spontanous Breathing and Patient connected to face mask oxygen  Post-op Assessment: Report given to RN and Post -op Vital signs reviewed and stable  Post vital signs: Reviewed and stable  Last Vitals:  Vitals Value Taken Time  BP 140/122 06/24/20 1035  Temp    Pulse 80 06/24/20 1037  Resp 19 06/24/20 1037  SpO2 100 % 06/24/20 1037  Vitals shown include unvalidated device data.  Last Pain:  Vitals:   06/24/20 0729  TempSrc:   PainSc: 0-No pain         Complications: No complications documented.

## 2020-06-24 NOTE — H&P (Signed)
Cory Thomas  DOB: 16-Jan-1951 Married / Language: Cory Thomas / Race: White Male  Patient Care Team: Cory Pollen, Cory Thomas as PCP - General (Internal Medicine) Cory Boston, Cory Thomas as Consulting Physician (General Surgery) Armbruster, Cory Raspberry, Cory Thomas as Consulting Physician (Gastroenterology)  ` ` Patient sent for surgical consultation at the request of Dr Cory Thomas and Cory Thomas with [facility] gastroenterology.  Chief Complaint: Posterior gastric wall mass most likely a gastrointestinal stromal tumor. ` ` The patient is a pleasant active male. He's had some complaints of nausea vomiting and bloating. Early satiety. Saw gastroenterology. Patient concerned that maybe he has pylorus dysfunction. Had a CAT scan done which showed a possible posterior gastric wall mass. Endoscopy confirmed this. 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 Surgical consultation requested. Patient also seemed to have some narrowing/stricturing of his distal esophagus was dilated. Patient notes some of his dysphagia has gone down but he still has early satiety and bloating. More frequent meals. He is a thin male quite active. No major weight loss. No hematemesis or coffee-ground emesis. He is not diabetic. Not on narcotics. No prior abdominal surgery. Usually moves his bowels twice a day. No severe constipation. He is originally from [place] and is in avid hiker. Actually has some plans to graft like to [employer] this fall. He does not smoke.  No personal nor family history of GI/colon cancer, inflammatory bowel disease, irritable bowel syndrome, allergy such as Celiac Sprue, dietary/dairy problems, colitis, ulcers nor gastritis. No recent sick contacts/gastroenteritis. No travel outside the country. No changes in diet. He does have some problems swallowing meats and thicker foods. He thinks the dilation helped a little bit  but it is still not normal. No dysphagia with liquids. No significant heartburn or reflux. No increased burping or belching. No melena, hematemesis, coffee ground emesis. No evidence of prior gastric/peptic ulceration.  (Review of systems as stated in this history (HPI) or in the review of systems. Otherwise all other 12 point ROS are negative) ` ` `  This patient encounter took 40 minutes today to perform the following: obtain history, perform exam, review outside records, interpret tests & imaging, counsel the patient on their diagnosis; and, document this encounter, including findings & plan in the electronic health record (EHR).   Past Surgical History Cory Thomas, Cory Thomas; 04/15/2020 9:31 AM) Colon Polyp Removal - Colonoscopy  Diagnostic Studies History Cory Thomas Cory Thomas, Cory Thomas; 04/15/2020 9:31 AM) Colonoscopy 1-5 years ago  Allergies (Cory Thomas, Cory Thomas; 04/15/2020 9:32 AM) No Known Drug Allergies [04/15/2020]: Allergies Reconciled  Medication History (Cory Thomas, [place]; 04/15/2020 9:32 AM) No Current Medications Medications Reconciled  Social History Cory Hector, Cory Thomas; 04/15/2020 10:11 AM) Originally from [place]  Other Problems Cory Thomas, Cory Thomas; 04/15/2020 9:31 AM) Anxiety Disorder Asthma Melanoma     Review of Systems (Cory Thomas; 04/15/2020 9:31 AM) HEENT Present- Wears glasses/contact lenses. Not Present- Earache, Hearing Loss, Hoarseness, Nose Bleed, Oral Ulcers, Ringing in the Ears, Seasonal Allergies, Sinus Pain, Sore Throat, Visual Disturbances and Yellow Eyes. Gastrointestinal Present- Gets full quickly at meals. Not Present- Abdominal Pain, Bloating, Bloody Stool, Change in Bowel Habits, Chronic diarrhea, Constipation, Difficulty Swallowing, Excessive gas, Hemorrhoids, Indigestion, Nausea, Rectal Pain and Vomiting.  Vitals (Cory Nolan Cory Thomas; 04/15/2020 9:32 AM) 04/15/2020 9:32 AM Weight: 140 lb  Height: 70in Body Surface Area: 1.79 m Body Mass Index: 20.09 kg/m  Temp.: 98.1F  Pulse: 80 (Regular)  BP: 118/74(Sitting, Left Arm,  Standard)        Physical Exam Cory Hector Cory Thomas; 04/15/2020 10:14 AM)  General Mental Status-Alert. General Appearance-Not in acute distress, Not Sickly. Orientation-Oriented X3. Hydration-Well hydrated. Voice-Normal.  Integumentary Global Assessment Upon inspection and palpation of skin surfaces of the - Axillae: non-tender, no inflammation or ulceration, no drainage. and Distribution of scalp and body hair is normal. General Characteristics Temperature - normal warmth is noted.  Head and Neck Head-normocephalic, atraumatic with no lesions or palpable masses. Face Global Assessment - atraumatic, no absence of expression. Neck Global Assessment - no abnormal movements, no bruit auscultated on the right, no bruit auscultated on the left, no decreased range of motion, non-tender. Trachea-midline. Thyroid Gland Characteristics - non-tender.  Eye Eyeball - Left-Extraocular movements intact, No Nystagmus - Left. Eyeball - Right-Extraocular movements intact, No Nystagmus - Right. Cornea - Left-No Hazy - Left. Cornea - Right-No Hazy - Right. Sclera/Conjunctiva - Left-No scleral icterus, No Discharge - Left. Sclera/Conjunctiva - Right-No scleral icterus, No Discharge - Right. Pupil - Left-Direct reaction to light normal. Pupil - Right-Direct reaction to light normal.  ENMT Ears Pinna - Left - no drainage observed, no generalized tenderness observed. Pinna - Right - no drainage observed, no generalized tenderness observed. Nose and Sinuses External Inspection of the Nose - no destructive lesion observed. Inspection of the nares - Left - quiet respiration. Inspection of the nares - Right - quiet respiration. Mouth and Throat Lips - Upper Lip - no fissures observed, no pallor noted. Lower  Lip - no fissures observed, no pallor noted. Nasopharynx - no discharge present. Oral Cavity/Oropharynx - Tongue - no dryness observed. Oral Mucosa - no cyanosis observed. Hypopharynx - no evidence of airway distress observed.  Chest and Lung Exam Inspection Movements - Normal and Symmetrical. Accessory muscles - No use of accessory muscles in breathing. Palpation Palpation of the chest reveals - Non-tender. Auscultation Breath sounds - Normal and Clear.  Cardiovascular Auscultation Rhythm - Regular. Murmurs & Other Heart Sounds - Auscultation of the heart reveals - No Murmurs and No Systolic Clicks.  Abdomen Inspection Inspection of the abdomen reveals - No Visible peristalsis and No Abnormal pulsations. Umbilicus - No Bleeding, No Urine drainage. Palpation/Percussion Palpation and Percussion of the abdomen reveal - Soft, Non Tender, No Rebound tenderness, No Rigidity (guarding) and No Cutaneous hyperesthesia. Note: Abdomen soft. Nontender. Not distended. No umbilical or incisional hernias. No guarding.  Male Genitourinary Sexual Maturity Tanner 5 - Adult hair pattern and Adult penile size and shape.  Peripheral Vascular Upper Extremity Inspection - Left - No Cyanotic nailbeds - Left, Not Ischemic. Inspection - Right - No Cyanotic nailbeds - Right, Not Ischemic.  Neurologic Neurologic evaluation reveals -normal attention span and ability to concentrate, able to name objects and repeat phrases. Appropriate fund of knowledge , normal sensation and normal coordination. Mental Status Affect - not angry, not paranoid. Cranial Nerves-Normal Bilaterally. Gait-Normal.  Neuropsychiatric Mental status exam performed with findings of-able to articulate well with normal speech/language, rate, volume and coherence, thought content normal with ability to perform basic computations and apply abstract reasoning and no evidence of hallucinations, delusions, obsessions or  homicidal/suicidal ideation.  Musculoskeletal Global Assessment Spine, Ribs and Pelvis - no instability, subluxation or laxity. Right Upper Extremity - no instability, subluxation or laxity.  Lymphatic Head & Neck  General Head & Neck Lymphatics: Bilateral - Description - No Localized lymphadenopathy. Axillary  General Axillary Region: Bilateral - Description - No Localized lymphadenopathy. Femoral & Inguinal  Generalized Femoral &  Inguinal Lymphatics: Left - Description - No Localized lymphadenopathy. Right - Description - No Localized lymphadenopathy.    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 Noticeable on endoscopy and CAT scan. Most likely posterior body/antrum of mid stomach.  I think he would benefit from wedge resection of the mass. Most likely robotic mobilization to get to the posterior wall and stapling. Possible intraoperative endoscopy. He is interested in proceeding.  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 Hopefully the risk of that is low.  He denies idea of a minimally invasive robotic approach and wishes to proceed. Hopefully he will recover safely as he is otherwise quite physically active.  Current Plans You are being scheduled for surgery- Our schedulers will call you.  You should hear from our office's scheduling department within 5 working days about the location, date, and time of surgery. We try to make accommodations for patient's preferences in scheduling surgery, but sometimes the OR schedule or the surgeon's schedule prevents [place] from making those accommodations.  If you have not heard from our office [phone]) in 5 working days, call the office and ask for your surgeon's nurse.  If you have other questions about your diagnosis, plan, or  surgery, call the office and ask for your surgeon's nurse.  Follow Up - Call [facility] office after tests / studies doneto discuss further plans The anatomy & physiology of the foregut and anti-reflux mechanism was discussed. The pathophysiology of Gastrointestinal stromal tumors (GISTs) and differential diagnosis was discussed. Natural history risks without surgery was discussed. The patient's symptoms are not adequately controlled by medicines and other non-operative treatments. I feel the risks of no intervention will lead to serious problems that outweigh the operative risks; therefore, I recommended surgery to remove the mass. Most likely involving partial gastrectomy wedge resection. Need for a thorough workup to rule out the differential diagnosis and plan treatment was explained. I explained laparoscopic techniques with possible need for an open approach. I will need to remove the mass en bloc. Therefore I will need extraction incision. Possible hand assist port as well.  Risks such as bleeding, infection, abscess, leak, need for further treatment, heart attack, death, and other risks were discussed. I noted a good likelihood this will help address the problem. Goals of post-operative recovery were discussed as well. Possibility that this will not correct all symptoms was explained. Post-operative dysphagia, need for short-term liquid & pureed diet, possible need for medicines to help control symptoms in addition to surgery were discussed. We will work to minimize complications. Possible need for Gleevec our Sutent postoperatively depending on the aggressiveness of the tumor was discussed as well. That would involve medical oncology consultation. Educational handouts further explaining the pathology, treatment options was given as well. Questions were answered. The patient expressed understanding & wished to proceed with surgery.  Pt Education - [facility] Laparoscopic Surgery HCI Pt  Education - [facility] Esophageal Surgery Diet HCI (Zuleika Gallus): discussed with patient and provided information.  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 I wonder if that is the source of his dysphagia. He is not as convinced. We'll defer back to gastroenterology.   EARLY SATIETY (R68.81) Impression: With his history of early satiety bloating and nausea vomiting, he is concerned  that he may have pyloric dysfunction as etiology. I think it is reasonable to get a gastric emptying study to help rule out and around. Hopefully not too likely since he does not have serious risk factors. However with the stomach surgery I think to be helpful to get a baseline preop since he is most likely going to have persistent complaints postoperative. If he has no gastroparesis or evidence of abnormaly gastric emptying, then can revisit the idea that perhaps is related to his esophageal stricture or consider manometry rule out esophageal disorders. Would defer back to gas GI. If he truly has severe delayed gastric emptying, he may benefit from pyloroplasty at the same time of surgery. Would like to know that preop.   PATIENT IS JEHOVAH'S WITNESS (Z78.9) Impression: Patient very involved in [place] in works with the hospital. He would like to avoid transfusions. Hopefully his low risk since he is not severely anemic and otherwise healthy and active.  Cory Hector, Cory Thomas, FACS, MASCRS Gastrointestinal and Minimally Invasive Surgery  [facility] [address], [place], [place] [address] [phone] Fax [phone] Main/Paging  CONTACT INFORMATION: Weekday (9AM-5PM) concerns: Call [facility] main office at [phone] Weeknight (5PM-9AM) or Weekend/Holiday concerns: Check [email or web address] for General Surgery [facility] coverage (Please, do not use  SecureChat as it is not reliable communication to operating surgeons for immediate patient care)

## 2020-06-24 NOTE — Anesthesia Procedure Notes (Addendum)
Procedure Name: Intubation Date/Time: 06/24/2020 8:37 AM Performed by: Audry Pili, MD Pre-anesthesia Checklist: Patient identified, Emergency Drugs available, Suction available and Patient being monitored Patient Re-evaluated:Patient Re-evaluated prior to induction Oxygen Delivery Method: Circle system utilized Preoxygenation: Pre-oxygenation with 100% oxygen Induction Type: IV induction Ventilation: Mask ventilation without difficulty Laryngoscope Size: Glidescope (LoPro 4) Grade View: Grade II Tube type: Oral Tube size: 7.5 mm Number of attempts: 2 (First attempt by Ruthann Cancer with Mac 3, esophageal intubation, noted immediately and tube withdrawn. Dr. Fransisco Beau attempt to visualize with Miller 3, then visualized with glidescope. ETT passsed, BBS, positive ET CO2. ) Airway Equipment and Method: Stylet and Video-laryngoscopy Placement Confirmation: ETT inserted through vocal cords under direct vision,  positive ETCO2 and breath sounds checked- equal and bilateral Secured at: 23 cm Tube secured with: Tape Dental Injury: Teeth and Oropharynx as per pre-operative assessment

## 2020-06-24 NOTE — Interval H&P Note (Signed)
History and Physical Interval Note:  06/24/2020 8:06 AM  Cory Thomas  has presented today for surgery, with the diagnosis of gist tumor.  The various methods of treatment have been discussed with the patient and family. After consideration of risks, benefits and other options for treatment, the patient has consented to  Procedure(s): ROBOTIC PARTIAL GASTRECTOMY (N/A) POSSIBLE ESOPHAGOGASTRODUODENOSCOPY (EGD) (N/A) as a surgical intervention.  The patient's history has been reviewed, patient examined, no change in status, stable for surgery.  I have reviewed the patient's chart and labs.  Questions were answered to the patient's satisfaction.    I have re-reviewed the the patient's records, history, medications, and allergies.  I have re-examined the patient.  I again discussed intraoperative plans and goals of post-operative recovery.  The patient agrees to proceed.  Cory Thomas  [birth year] [id number]  Patient Care Team: Horald Pollen, MD as PCP - General (Internal Medicine) Michael Boston, MD as Consulting Physician (General Surgery) Armbruster, [name], MD as Consulting Physician (Gastroenterology)  Patient Active Problem List   Diagnosis Date Noted   Refusal of blood transfusions as patient is Jehovah's Witness 04/15/2020   Dysphagia 04/15/2020   Abdominal bloating 04/15/2020   Stricture and stenosis of esophagus s/p EGD dilitation May 2021 04/15/2020   Gastrointestinal stromal tumor (GIST) of stomach ([place])     Past Medical History:  Diagnosis Date   Allergy    Anxiety    per pt after father died   Asthma    age 69   Cancer ([place])    skin on arms and legs years ago rmoved over 10 yrs ago   Gastritis    years ago   Osteoporosis    Refusal of blood product     Past Surgical History:  Procedure Laterality Date   CLOSED REDUCTION WRIST FRACTURE     69 years old   ESOPHAGOGASTRODUODENOSCOPY N/A 03/05/2020   Procedure: ESOPHAGOGASTRODUODENOSCOPY (EGD);   Surgeon: Milus Banister, MD;  Location: [facility];  Service: Endoscopy;  Laterality: N/A;   EUS N/A 03/05/2020   Procedure: UPPER ENDOSCOPIC ULTRASOUND (EUS) LINEAR;  Surgeon: Milus Banister, MD;  Location: [facility];  Service: Endoscopy;  Laterality: N/A;   FINE NEEDLE ASPIRATION N/A 03/05/2020   Procedure: FINE NEEDLE ASPIRATION (FNA) LINEAR;  Surgeon: Milus Banister, MD;  Location: [facility];  Service: Endoscopy;  Laterality: N/A;   POLYPECTOMY  12/2018   colon   TONSILLECTOMY     WISDOM TOOTH EXTRACTION      Social History   Socioeconomic History   Marital status: Married    Spouse name: Not on file   Number of children: Not on file   Years of education: Not on file   Highest education level: Not on file  Occupational History   Occupation: Probation officer  Tobacco Use   Smoking status: Never Smoker   Smokeless tobacco: Never Used  Vaping Use   Vaping Use: Never used  Substance and Sexual Activity   Alcohol use: Yes    Alcohol/week: 3.0 standard drinks    Types: 3 Glasses of wine per week    Comment: 1-2 bottles of beer/week, red wine   Drug use: Never   Sexual activity: Not on file  Other Topics Concern   Not on file  Social History Narrative   Jehovah's Witness   From [place] to hike   Social Determinants of Health   Financial Resource Strain:    Difficulty of Paying  Living Expenses: Not on file  Food Insecurity:    Worried About [place] in the Last Year: Not on file   Ran Out of Food in the Last Year: Not on file  Transportation Needs:    Lack of Transportation (Medical): Not on file   Lack of Transportation (Non-Medical): Not on file  Physical Activity:    Days of Exercise per Week: Not on file   Minutes of Exercise per Session: Not on file  Stress:    Feeling of Stress : Not on file  Social Connections:    Frequency of Communication with Friends and Family: Not on file   Frequency of Social Gatherings with Friends and  Family: Not on file   Attends Religious Services: Not on file   Active Member of Clubs or Organizations: Not on file   Attends Archivist Meetings: Not on file   Marital Status: Not on file  Intimate Partner Violence:    Fear of Current or Ex-Partner: Not on file   Emotionally Abused: Not on file   Physically Abused: Not on file   Sexually Abused: Not on file    Family History  Problem Relation Age of Onset   Dementia Father    Colon cancer Neg Hx    Colon polyps Neg Hx    Esophageal cancer Neg Hx    Rectal cancer Neg Hx    Stomach cancer Neg Hx     Medications Prior to Admission  Medication Sig Dispense Refill Last Dose   Alpha-D-Galactosidase (BEANO) TABS Take 2 tablets by mouth daily. 800 Galu   06/23/2020 at Unknown time   calcium carbonate (OS-CAL - DOSED IN MG OF ELEMENTAL CALCIUM) 1250 (500 Ca) MG tablet Take 1 tablet by mouth.   06/23/2020 at Unknown time   Cholecalciferol (VITAMIN D3) 50 MCG (2000 UT) TABS Take 4,000 Units by mouth daily.   06/23/2020 at Unknown time    Current Facility-Administered Medications  Medication Dose Route Frequency Provider Last Rate Last Admin   bupivacaine liposome (EXPAREL) 1.3 % injection 266 mg  20 mL Infiltration Once Michael Boston, MD       cefTRIAXone (ROCEPHIN) 2 g in sodium chloride 0.9 % 100 mL IVPB  2 g Intravenous On Call to OR Michael Boston, MD       And   metroNIDAZOLE (FLAGYL) IVPB 500 mg  500 mg Intravenous On Call to OR Michael Boston, MD       Chlorhexidine Gluconate Cloth 2 % PADS 6 each  6 each Topical Once Michael Boston, MD       dexamethasone (DECADRON) injection 4 mg  4 mg Intravenous On Call to OR Michael Boston, MD       Derrill Memo ON 06/25/2020] feeding supplement (ENSURE PRE-SURGERY) liquid 296 mL  296 mL Oral Once Michael Boston, MD       feeding supplement (ENSURE PRE-SURGERY) liquid 592 mL  592 mL Oral Once Michael Boston, MD       lactated ringers infusion   Intravenous Continuous Lynda Rainwater, MD 10  mL/hr at 06/24/20 0746 Continued from Pre-op at 06/24/20 0746   scopolamine (TRANSDERM-SCOP) 1 MG/3DAYS 1.5 mg  1 patch Transdermal On Call to OR Michael Boston, MD   1.5 mg at 06/24/20 0731     Allergies  Allergen Reactions   Other     Refuses Blood    BP 133/63   Pulse 71   Temp 97.8 F (36.6 C) (Oral)   Resp  16   Ht 5\' 10"  (1.778 m)   Wt 63.7 kg   SpO2 100%   BMI 20.14 kg/m   Labs: No results found for this or any previous visit (from the past 48 hour(s)).  Imaging / Studies: No results found.   Adin Hector, M.D., F.A.C.S. Gastrointestinal and Minimally Invasive Surgery [facility] [address], [place], [place] [address] [phone] Main / Paging  06/24/2020 8:06 AM    Adin Hector

## 2020-06-24 NOTE — Op Note (Signed)
06/24/2020  10:39 AM  PATIENT:  Cory Thomas  68 y.o. male  Patient Care Team: Horald Pollen, MD as PCP - General (Internal Medicine) Michael Boston, MD as Consulting Physician (General Surgery) Armbruster, [name], MD as Consulting Physician (Gastroenterology)  PRE-OPERATIVE DIAGNOSIS:  Gastrointestinal tumor (GIST) of stomach  POST-OPERATIVE DIAGNOSIS:  Gastrointestinal tumor (GIST) of stomach  PROCEDURE:   ROBOTIC PARTIAL GASTRECTOMY BILATERAL TAP BLOCK  SURGEON:  Adin Hector, MD  ASSIST:  Stark Klein, MD, FACS  ANESTHESIA:   local and general   Nerve block provided with liposomal bupivacaine (Experel) mixed with 0.25% bupivacaine as a Bilateral TAP block x 99mL each side at the level of the transverse abdominis & preperitoneal spaces along the flank at the anterior axillary line, from subcostal ridge to iliac crest under laparoscopic guidance    EBL:  Total I/O In: 1450 [I.V.:1000; IV Piggyback:450] Out: 50 [Blood:50]  Delay start of Pharmacological VTE agent (>24hrs) due to surgical blood loss or risk of bleeding:  no  SPECIMEN:  Mediastinal hernia sac (not sent).  DRAINS:  NONE  COUNTS:  YES  PLAN OF CARE: Admit to inpatient   PATIENT DISPOSITION:  PACU - hemodynamically stable.  INDICATION: Gentleman with history of early satiety bloating. CAT scan suspicious for intraluminal gastric mass. 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 I recommended minimally invasive exploration with partial gastrectomy  The anatomy & physiology of the foregut and anti-reflux mechanism was discussed.  The pathophysiology of Gastrointestinal stromal tumors (GISTs) and differential diagnosis was discussed.  Natural history risks without surgery was discussed.   The patient's symptoms are not adequately controlled by medicines and other non-operative treatments.  I feel the risks of no intervention will lead to serious problems that outweigh  the operative risks; therefore, I recommended surgery to remove the mass.  Most likely involving partial gastrectomy wedge resection.  Need for a thorough workup to rule out the differential diagnosis and plan treatment was explained.  I explained laparoscopic techniques with possible need for an open approach.  I will need to remove the mass en bloc.  Therefore I will need extraction incision.  Possible hand assist port as well.  Risks such as bleeding, infection, abscess, leak, injury to other organs, need for repair of tissues / organs, need for further treatment, heart attack, death, and other risks were discussed.   I noted a good likelihood this will help address the problem.  Goals of post-operative recovery were discussed as well.  Possibility that this will not correct all symptoms was explained.  Post-operative dysphagia, need for short-term liquid & pureed diet, possible need for medicines to help control symptoms in addition to surgery were discussed.  We will work to minimize complications.  Possible need for Gleevec our Sutent postoperatively depending on the aggressiveness of the tumor was discussed as well.  That would involve medical oncology consultation.  Educational handouts further explaining the pathology, treatment options was given as well.  Questions were answered.  The patient expressed understanding & wished to proceed with surgery.   OR FINDINGS: Spherical intraluminal gastric mass about the size of the raquetball on the mid lesser curvature of the posterior stomach. Sleeve gastrectomy from lesser curvature side done to remove mass  DESCRIPTION:   Informed consent was confirmed.  The patient received IV antibiotics prior to incision.  The underwent general anesthesia without difficulty.  A Foley catheter sterilely placed.  The patient was positioned in split leg with arms tucked.  The abdomen was prepped and draped in the sterile fashion.  Surgical time-out confirmed our  plan.  I placed a 8 mm port in the left midabdomen using Varess entry technique with the patient in steep reverse Trendelenburg and left side up.  Entry was clean.  We induced carbon dioxide insufflation.  Camera inspection revealed no injury.  Under direct visualization, I placed extra ports.  The Xi robot was carefully docked and instruments placed and advanced under direct visualization.  We focused on dissection. I was using the greater curvature the stomach and transected through the greater omentum transversely into the lesser sac. Able to remove the posterior stomach to find obvious gastric wall mass. Little more proximal CAT scan. Near the mid to proximal lesser curvature of the stomach. I open the lesion, eight. Having to go through the epigastric ligament as well skeletonized lesser curvature to get good mobilization of the mass. On the posterior gastric wall very close to the lesser curve.  I transected the mass off the 60 mm green load stapler along the lesser curve starting at the distal lesser curve and coming more proximally, an inner sleeve gastrectomy. Took care to avoid any narrowing at the cardia and proximal body of the stomach. Got good margins. Hemostasis was good. We asked anesthesia to dilute the indocyanine green (ICG) to 10 mL and inject 3 mL intravenously with IV flush.  I switched to the NIR fluorescence (Firefly mode) imaging window on the daVinci platform.  I was able to see good light green visualization of blood vessels with good perfusion of tissues, confirming good tissue perfusion of the remaining stomach.  Did reinspection.  Staple line looked healthy.  Staple line intact.  No bleeding.  Blood loss was minimal.  No surprises elsewhere.  I saw no evidence of any leak or perforation or other abnormality.  I evacuated carbon dioxide and removed the ports.  We created a Pfannenstiel small incision in the suprapubic region incorporating the 12/m stapler port site.  Placed a  wound protector and the specimen.  Dr. Barry Dienes opened on the back table and confirmed good Amelya Mabry margins circumferentially.  We closed the Pfannenstiel incision using 0 Vicryl vertically on the posterior rectus fascia and with #1 PDS transversely on the anterior.    The skin was closed with Monocryl and sterile dressings applied.  The patient is extubated and is stable in the PACU recovery room.  I discussed postop care in detail with the patient and family in in the office.  Discussed again with the patient in the holding area.  I discussed operative findings, updated the patient's status, discussed probable steps to recovery, and gave postoperative recommendations to the patient's spouse, Cory Thomas.  Recommendations were made.  Questions were answered.  She expressed understanding & appreciation.  Adin Hector, M.D., F.A.C.S. Gastrointestinal and Minimally Invasive Surgery [facility] [address] [place], [place] [address] [phone] Main / Paging

## 2020-06-24 NOTE — Anesthesia Postprocedure Evaluation (Signed)
Anesthesia Post Note  Patient: Cory Thomas  Procedure(s) Performed: ROBOTIC PARTIAL GASTRECTOMY, BILATERAL TAP BLOCK (N/A Abdomen)     Patient location during evaluation: PACU Anesthesia Type: General Level of consciousness: awake and alert Pain management: pain level controlled Vital Signs Assessment: post-procedure vital signs reviewed and stable Respiratory status: spontaneous breathing, nonlabored ventilation, respiratory function stable and patient connected to nasal cannula oxygen Cardiovascular status: blood pressure returned to baseline and stable Postop Assessment: no apparent nausea or vomiting Anesthetic complications: no   No complications documented.  Last Vitals:  Vitals:   06/24/20 1130 06/24/20 1150  BP: 107/60 (!) 111/45  Pulse: 67 (!) 57  Resp: (!) 23 18  Temp:  (!) 36.3 C  SpO2: 100% 100%    Last Pain:  Vitals:   06/24/20 1150  TempSrc: Oral  PainSc:                  Audry Pili

## 2020-06-25 LAB — BASIC METABOLIC PANEL
Anion gap: 8 (ref 5–15)
BUN: 12 mg/dL (ref 8–23)
CO2: 25 mmol/L (ref 22–32)
Calcium: 8.7 mg/dL — ABNORMAL LOW (ref 8.9–10.3)
Chloride: 106 mmol/L (ref 98–111)
Creatinine, Ser: 0.91 mg/dL (ref 0.61–1.24)
GFR, Estimated: >60 mL/min (ref >60)
Glucose, Bld: 119 mg/dL — ABNORMAL HIGH (ref 70–99)
Potassium: 4.3 mmol/L (ref 3.5–5.1)
Sodium: 139 mmol/L (ref 135–145)

## 2020-06-25 LAB — CBC
HCT: 40.2 % (ref 39.0–52.0)
Hemoglobin: 13.5 g/dL (ref 13.0–17.0)
MCH: 31.4 pg (ref 26.0–34.0)
MCHC: 33.6 g/dL (ref 30.0–36.0)
MCV: 93.5 fL (ref 80.0–100.0)
Platelets: 133 10*3/uL — ABNORMAL LOW (ref 150–400)
RBC: 4.3 MIL/uL (ref 4.22–5.81)
RDW: 12.9 % (ref 11.5–15.5)
WBC: 12.6 10*3/uL — ABNORMAL HIGH (ref 4.0–10.5)
nRBC: 0 % (ref 0.0–0.2)

## 2020-06-25 LAB — MAGNESIUM: Magnesium: 2.1 mg/dL (ref 1.7–2.4)

## 2020-06-25 MED ORDER — ONDANSETRON HCL 4 MG PO TABS: 4.0000 mg | ORAL_TABLET | Freq: Three times a day (TID) | ORAL | 2 refills | Status: DC | PRN

## 2020-06-25 NOTE — Progress Notes (Signed)
Patient given discharge instructions, and all questions were answered.  Patient was walked to the main exit.

## 2020-06-25 NOTE — Discharge Summary (Signed)
Physician Discharge Summary    Patient ID: Cory Thomas MRN: [id number] DOB/AGE: [birth year]  69 y.o.  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 Sleeve gastrectomy from lesser curvature side done to remove mass   Consults: None  Hospital Course:   The patient underwent the surgery above.  Postoperatively, the patient gradually mobilized and advanced to a solid diet.  Pain and other symptoms were treated aggressively.    By the time of discharge, the patient was walking well the hallways, eating food, having flatus.  Pain was well-controlled on an oral medications.  Based on meeting discharge criteria and continuing to recover, I felt it was safe for the patient to be discharged from the hospital to further recover with close followup. Postoperative recommendations were discussed in detail.  They are written as well.  Discharged Condition: good  Discharge Exam: Blood pressure (!) 98/59, pulse 60, temperature 98.2 F (36.8 C), temperature source Oral, resp. rate 17, height 5\' 10"  (1.778 m), weight 63.7 kg, SpO2 96  %.  General: Pt awake/alert/oriented x4 in No acute distress Eyes: PERRL, normal EOM.  Sclera clear.  No icterus Neuro: CN II-XII intact w/o focal sensory/motor deficits. Lymph: No head/neck/groin lymphadenopathy Psych:  No delerium/psychosis/paranoia HENT: Normocephalic, Mucus membranes moist.  No thrush Neck: Supple, No tracheal deviation Chest: No chest wall pain w good excursion CV:  Pulses intact.  Regular rhythm MS: Normal AROM mjr joints.  No obvious deformity Abdomen: Soft.  Nondistended.  Nontender.  No evidence of peritonitis.  No incarcerated hernias. Ext:  SCDs BLE.  No mjr edema.  No cyanosis Skin: No petechiae / purpura   Disposition:    Follow-up Information    Michael Boston, MD. Schedule an appointment as soon as possible for a visit in 3 weeks.   Specialty: General Surgery Why: To follow up after your operation, To follow up after your hospital stay Contact information: [place] [address] [phone]               Discharge disposition: 01-Home or Self Care       Discharge Instructions    Call MD for:   Complete by: As directed    FEVER > 101.5 F  (temperatures < 101.5 F are not significant)   Call MD for:   Complete by: As directed    FEVER > 101.5 F  (temperatures < 101.5 F are not significant)   Call MD for:  extreme fatigue   Complete by: As directed    Call MD for:  extreme fatigue   Complete by: As directed    Call MD for:  persistant dizziness or light-headedness   Complete by: As  directed    Call MD for:  persistant dizziness or light-headedness   Complete by: As directed    Call MD for:  persistant nausea and vomiting   Complete by: As directed    Call MD for:  persistant nausea and vomiting   Complete by: As directed    Call MD for:  redness, tenderness, or signs of infection (pain, swelling, redness, odor or green/yellow discharge around incision site)   Complete by: As directed    Call MD for:   redness, tenderness, or signs of infection (pain, swelling, redness, odor or green/yellow discharge around incision site)   Complete by: As directed    Call MD for:  severe uncontrolled pain   Complete by: As directed    Call MD for:  severe uncontrolled pain   Complete by: As directed    Discharge instructions   Complete by: As directed    See Discharge Instructions If you are not getting better after two weeks or are noticing you are getting worse, contact our office [phone] for further advice.  We may need to adjust your medications, re-evaluate you in the office, send you to the emergency room, or see what other things we can do to help. The clinic staff is available to answer your questions during regular business hours (8:30am-5pm).  Please don't hesitate to call and ask to speak to one of our nurses for clinical concerns.    A surgeon from [facility] is always on call at the hospitals 24 hours/day If you have a medical emergency, go to the nearest emergency room or call 911.   Discharge instructions   Complete by: As directed    See Discharge Instructions If you are not getting better after two weeks or are noticing you are getting worse, contact our office [phone] for further advice.  We may need to adjust your medications, re-evaluate you in the office, send you to the emergency room, or see what other things we can do to help. The clinic staff is available to answer your questions during regular business hours (8:30am-5pm).  Please don't hesitate to call and ask to speak to one of our nurses for clinical concerns.    A surgeon from [facility] is always on call at the hospitals 24 hours/day If you have a medical emergency, go to the nearest emergency room or call 911.   Discharge wound care:   Complete by: As directed    It is good for closed incisions and even open wounds to be washed every day.  Shower every day.  Short baths are fine.  Wash  the incisions and wounds clean with soap & water.    You may leave closed incisions open to air if it is dry.   You may cover the incision with clean gauze & replace it after your daily shower for comfort.   Discharge wound care:   Complete by: As directed    It is good for closed incisions and even open wounds to be washed every day.  Shower every day.  Short baths are fine.  Wash the incisions and wounds clean with soap & water.    You may leave closed incisions open to air if it is dry.   You may cover the incision with clean gauze & replace it after your daily shower for comfort.  TEGADERM:  You have clear gauze band-aid dressings over your closed incision(s).  Remove the dressings 3 days after surgery.  Driving Restrictions   Complete by: As directed    You may drive when: - you are no longer taking narcotic prescription pain medication - you can comfortably wear a seatbelt - you can safely make sudden turns/stops without pain.   Driving Restrictions   Complete by: As directed    You may drive when: - you are no longer taking narcotic prescription pain medication - you can comfortably wear a seatbelt - you can safely make sudden turns/stops without pain.   Increase activity slowly   Complete by: As directed    Start light daily activities --- self-care, walking, climbing stairs- beginning the day after surgery.  Gradually increase activities as tolerated.  Control your pain to be active.  Stop when you are tired.  Ideally, walk several times a day, eventually an hour a day.   Most people are back to most day-to-day activities in a few weeks.  It takes 4-6 weeks to get back to unrestricted, intense activity. If you can walk 30 minutes without difficulty, it is safe to try more intense activity such as jogging, treadmill, bicycling, low-impact aerobics, swimming, etc. Save the most intensive and strenuous activity for last (Usually 4-8 weeks after surgery) such as sit-ups, heavy lifting,  contact sports, etc.  Refrain from any intense heavy lifting or straining until you are off narcotics for pain control.  You will have off days, but things should improve week-by-week. DO NOT PUSH THROUGH PAIN.  Let pain be your guide: If it hurts to do something, don't do it.   Increase activity slowly   Complete by: As directed    Start light daily activities --- self-care, walking, climbing stairs- beginning the day after surgery.  Gradually increase activities as tolerated.  Control your pain to be active.  Stop when you are tired.  Ideally, walk several times a day, eventually an hour a day.   Most people are back to most day-to-day activities in a few weeks.  It takes 4-6 weeks to get back to unrestricted, intense activity. If you can walk 30 minutes without difficulty, it is safe to try more intense activity such as jogging, treadmill, bicycling, low-impact aerobics, swimming, etc. Save the most intensive and strenuous activity for last (Usually 4-8 weeks after surgery) such as sit-ups, heavy lifting, contact sports, etc.  Refrain from any intense heavy lifting or straining until you are off narcotics for pain control.  You will have off days, but things should improve week-by-week. DO NOT PUSH THROUGH PAIN.  Let pain be your guide: If it hurts to do something, don't do it.   Lifting restrictions   Complete by: As directed    If you can walk 30 minutes without difficulty, it is safe to try more intense activity such as jogging, treadmill, bicycling, low-impact aerobics, swimming, etc. Save the most intensive and strenuous activity for last (Usually 4-8 weeks after surgery) such as sit-ups, heavy lifting, contact sports, etc.   Refrain from any intense heavy lifting or straining until you are off narcotics for pain control.  You will have off days, but things should improve week-by-week. DO NOT PUSH THROUGH PAIN.  Let pain be your guide: If it hurts to do something, don't do it.  Pain is your  body warning you to avoid that activity for another week until the pain goes down.   Lifting restrictions   Complete by: As directed    If you can walk 30 minutes without difficulty, it is safe to try  more intense activity such as jogging, treadmill, bicycling, low-impact aerobics, swimming, etc. Save the most intensive and strenuous activity for last (Usually 4-8 weeks after surgery) such as sit-ups, heavy lifting, contact sports, etc.   Refrain from any intense heavy lifting or straining until you are off narcotics for pain control.  You will have off days, but things should improve week-by-week. DO NOT PUSH THROUGH PAIN.  Let pain be your guide: If it hurts to do something, don't do it.  Pain is your body warning you to avoid that activity for another week until the pain goes down.   May shower / Bathe   Complete by: As directed    May shower / Bathe   Complete by: As directed    May walk up steps   Complete by: As directed    May walk up steps   Complete by: As directed    Remove dressing in 72 hours   Complete by: As directed    Make sure all dressings are removed by the third day after surgery.  Leave incisions open to air.  OK to cover incisions with gauze or bandages as desired   Remove dressing in 72 hours   Complete by: As directed    Make sure all dressings are removed by the third day after surgery.  Leave incisions open to air.  OK to cover incisions with gauze or bandages as desired   Sexual Activity Restrictions   Complete by: As directed    You may have sexual intercourse when it is comfortable. If it hurts to do something, stop.   Sexual Activity Restrictions   Complete by: As directed    You may have sexual intercourse when it is comfortable. If it hurts to do something, stop.      Allergies as of 06/25/2020      Reactions   Other    Refuses Blood      Medication List    TAKE these medications   Beano Tabs Take 2 tablets by mouth daily. 800 Galu   calcium  carbonate 1250 (500 Ca) MG tablet Commonly known as: OS-CAL - dosed in mg of elemental calcium Take 1 tablet by mouth.   ondansetron 4 MG tablet Commonly known as: ZOFRAN Take 1 tablet (4 mg total) by mouth every 8 (eight) hours as needed for refractory nausea / vomiting.   traMADol 50 MG tablet Commonly known as: ULTRAM Take 1 tablet (50 mg total) by mouth every 6 (six) hours as needed (mild pain).   Vitamin D3 50 MCG (2000 UT) Tabs Take 4,000 Units by mouth daily.            Discharge Care Instructions  (From admission, onward)         Start     Ordered   06/25/20 0000  Discharge wound care:       Comments: It is good for closed incisions and even open wounds to be washed every day.  Shower every day.  Short baths are fine.  Wash the incisions and wounds clean with soap & water.    You may leave closed incisions open to air if it is dry.   You may cover the incision with clean gauze & replace it after your daily shower for comfort.  TEGADERM:  You have clear gauze band-aid dressings over your closed incision(s).  Remove the dressings 3 days after surgery.   06/25/20 0840   06/24/20 0000  Discharge wound care:  Comments: It is good for closed incisions and even open wounds to be washed every day.  Shower every day.  Short baths are fine.  Wash the incisions and wounds clean with soap & water.    You may leave closed incisions open to air if it is dry.   You may cover the incision with clean gauze & replace it after your daily shower for comfort.   06/24/20 1517          Significant Diagnostic Studies:  Results for orders placed or performed during the hospital encounter of 06/24/20 (from the past 72 hour(s))  CBC     Status: Abnormal   Collection Time: 06/24/20 12:35 PM  Result Value Ref Range   WBC 13.1 (H) 4.0 - 10.5 K/uL   RBC 4.56 4.22 - 5.81 MIL/uL   Hemoglobin 14.4 13.0 - 17.0 g/dL   HCT 41.8 39 - 52 %   MCV 91.7 80.0 - 100.0 fL   MCH 31.6 26.0 - 34.0  pg   MCHC 34.4 30.0 - 36.0 g/dL   RDW 12.8 11.5 - 15.5 %   Platelets 146 (L) 150 - 400 K/uL   nRBC 0.0 0.0 - 0.2 %    Comment: Performed at [facility], [place] [address], [place], [place] [address]  Creatinine, serum     Status: None   Collection Time: 06/24/20 12:35 PM  Result Value Ref Range   Creatinine, Ser 0.84 0.61 - 1.24 mg/dL   GFR, Estimated >60 >60 mL/min    Comment: Performed at [facility], [place] [address] [place], [place] [address]  Basic metabolic panel     Status: Abnormal   Collection Time: 06/25/20  5:24 AM  Result Value Ref Range   Sodium 139 135 - 145 mmol/L   Potassium 4.3 3.5 - 5.1 mmol/L   Chloride 106 98 - 111 mmol/L   CO2 25 22 - 32 mmol/L   Glucose, Bld 119 (H) 70 - 99 mg/dL    Comment: Glucose reference range applies only to samples taken after fasting for at least 8 hours.   BUN 12 8 - 23 mg/dL   Creatinine, Ser 0.91 0.61 - 1.24 mg/dL   Calcium 8.7 (L) 8.9 - 10.3 mg/dL   GFR, Estimated >60 >60 mL/min   Anion gap 8 5 - 15    Comment: Performed at [facility], [place] [address] [place], [place] [address]  Magnesium     Status: None   Collection Time: 06/25/20  5:24 AM  Result Value Ref Range   Magnesium 2.1 1.7 - 2.4 mg/dL    Comment: Performed at [facility], [place] [address] [place], [place] [address]  CBC     Status: Abnormal   Collection Time: 06/25/20  5:24 AM  Result Value Ref Range   WBC 12.6 (H) 4.0 - 10.5 K/uL   RBC 4.30 4.22 - 5.81 MIL/uL   Hemoglobin 13.5 13.0 - 17.0 g/dL   HCT 40.2 39 - 52 %   MCV 93.5 80.0 - 100.0 fL   MCH 31.4 26.0 - 34.0 pg   MCHC 33.6 30.0 - 36.0 g/dL   RDW 12.9 11.5 - 15.5 %   Platelets 133 (L) 150 - 400 K/uL   nRBC 0.0 0.0 - 0.2 %    Comment: Performed at [facility], [place] [address], [place], [place] [address]    No results found.  Past Medical History:  Diagnosis Date  .  Allergy   . Anxiety    per pt after  father died  . Asthma    age 89  . Cancer ([place])    skin on arms and legs years ago rmoved over 10 yrs ago  . Gastritis    years ago  . Osteoporosis   . Refusal of blood product     Past Surgical History:  Procedure Laterality Date  . CLOSED REDUCTION WRIST FRACTURE     69 years old  . ESOPHAGOGASTRODUODENOSCOPY N/A 03/05/2020   Procedure: ESOPHAGOGASTRODUODENOSCOPY (EGD);  Surgeon: Milus Banister, MD;  Location: [facility];  Service: Endoscopy;  Laterality: N/A;  . EUS N/A 03/05/2020   Procedure: UPPER ENDOSCOPIC ULTRASOUND (EUS) LINEAR;  Surgeon: Milus Banister, MD;  Location: [facility];  Service: Endoscopy;  Laterality: N/A;  . FINE NEEDLE ASPIRATION N/A 03/05/2020   Procedure: FINE NEEDLE ASPIRATION (FNA) LINEAR;  Surgeon: Milus Banister, MD;  Location: [facility];  Service: Endoscopy;  Laterality: N/A;  . POLYPECTOMY  12/2018   colon  . TONSILLECTOMY    . WISDOM TOOTH EXTRACTION      Social History   Socioeconomic History  . Marital status: Married    Spouse name: Not on file  . Number of children: Not on file  . Years of education: Not on file  . Highest education level: Not on file  Occupational History  . Occupation: Probation officer  Tobacco Use  . Smoking status: Never Smoker  . Smokeless tobacco: Never Used  Vaping Use  . Vaping Use: Never used  Substance and Sexual Activity  . Alcohol use: Yes    Alcohol/week: 3.0 standard drinks    Types: 3 Glasses of wine per week    Comment: 1-2 bottles of beer/week, red wine  . Drug use: Never  . Sexual activity: Not on file  Other Topics Concern  . Not on file  Social History Narrative   Jehovah's Witness   From [place] to hike   Social Determinants of Health   Financial Resource Strain:   . Difficulty of Paying Living Expenses: Not on file  Food Insecurity:   . Worried About Charity fundraiser in the Last Year: Not on file  . Ran Out of Food in the Last Year: Not on file   Transportation Needs:   . Lack of Transportation (Medical): Not on file  . Lack of Transportation (Non-Medical): Not on file  Physical Activity:   . Days of Exercise per Week: Not on file  . Minutes of Exercise per Session: Not on file  Stress:   . Feeling of Stress : Not on file  Social Connections:   . Frequency of Communication with Friends and Family: Not on file  . Frequency of Social Gatherings with Friends and Family: Not on file  . Attends Religious Services: Not on file  . Active Member of Clubs or Organizations: Not on file  . Attends Archivist Meetings: Not on file  . Marital Status: Not on file  Intimate Partner Violence:   . Fear of Current or Ex-Partner: Not on file  . Emotionally Abused: Not on file  . Physically Abused: Not on file  . Sexually Abused: Not on file    Family History  Problem Relation Age of Onset  . Dementia Father   . Colon cancer Neg Hx   . Colon polyps Neg Hx   . Esophageal cancer Neg Hx   . Rectal cancer Neg Hx   .  Stomach cancer Neg Hx     Current Facility-Administered Medications  Medication Dose Route Frequency Provider Last Rate Last Admin  . 0.9 %  sodium chloride infusion   Intravenous Q8H PRN Michael Boston, MD      . 0.9 %  sodium chloride infusion  250 mL Intravenous PRN Michael Boston, MD      . acetaminophen (TYLENOL) tablet 1,000 mg  1,000 mg Oral Lajuana Ripple, MD   1,000 mg at 06/25/20 0515  . bisacodyl (DULCOLAX) suppository 10 mg  10 mg Rectal Daily PRN Michael Boston, MD      . calcium carbonate (OS-CAL - dosed in mg of elemental calcium) tablet 500 mg of elemental calcium  1 tablet Oral Q breakfast Michael Boston, MD      . cholecalciferol (VITAMIN D3) tablet 4,000 Units  4,000 Units Oral Daily Michael Boston, MD   4,000 Units at 06/24/20 1237  . diphenhydrAMINE (BENADRYL) 12.5 MG/5ML elixir 12.5 mg  12.5 mg Oral Q6H PRN Michael Boston, MD       Or  . diphenhydrAMINE (BENADRYL) injection 12.5 mg  12.5 mg  Intravenous Q6H PRN Michael Boston, MD      . enalaprilat (VASOTEC) injection 0.625-1.25 mg  0.625-1.25 mg Intravenous Q6H PRN Michael Boston, MD      . enoxaparin (LOVENOX) injection 40 mg  40 mg Subcutaneous Q24H Michael Boston, MD      . gabapentin (NEURONTIN) capsule 300 mg  300 mg Oral BID Michael Boston, MD   300 mg at 06/24/20 2119  . lip balm (CARMEX) ointment 1 application  1 application Topical BID Michael Boston, MD   1 application at 12/45/80 2120  . magic mouthwash  15 mL Oral QID PRN Michael Boston, MD      . magnesium hydroxide (MILK OF MAGNESIA) suspension 30 mL  30 mL Oral Daily PRN Michael Boston, MD      . methocarbamol (ROBAXIN) 1,000 mg in dextrose 5 % 100 mL IVPB  1,000 mg Intravenous Q6H PRN Michael Boston, MD      . methocarbamol (ROBAXIN) tablet 500 mg  500 mg Oral Q6H PRN Michael Boston, MD      . metoCLOPramide (REGLAN) injection 5-10 mg  5-10 mg Intravenous Q8H PRN Michael Boston, MD      . metoprolol tartrate (LOPRESSOR) injection 5 mg  5 mg Intravenous Q6H PRN Michael Boston, MD      . oxyCODONE (Oxy IR/ROXICODONE) immediate release tablet 5-10 mg  5-10 mg Oral Q4H PRN Michael Boston, MD      . polyethylene glycol (MIRALAX / GLYCOLAX) packet 17 g  17 g Oral Daily Michael Boston, MD   17 g at 06/24/20 1238  . prochlorperazine (COMPAZINE) injection 5-10 mg  5-10 mg Intravenous Q4H PRN Michael Boston, MD      . simethicone [facility]) chewable tablet 40 mg  40 mg Oral Q6H PRN Michael Boston, MD      . sodium chloride flush (NS) 0.9 % injection 3 mL  3 mL Intravenous Gorden Harms, MD   3 mL at 06/24/20 2118  . sodium chloride flush (NS) 0.9 % injection 3 mL  3 mL Intravenous PRN Michael Boston, MD      . traMADol Veatrice Bourbon) tablet 50 mg  50 mg Oral Q6H PRN Michael Boston, MD         Allergies  Allergen Reactions  . Other     Refuses Blood    Signed: Morton Peters,  MD, FACS, MASCRS Gastrointestinal and Minimally Invasive Surgery  [facility] [address] [place], [place] [address] [phone] Fax [phone] Main/Paging  CONTACT INFORMATION: Weekday (9AM-5PM) concerns: Call [facility] main office at [phone] Weeknight (5PM-9AM) or Weekend/Holiday concerns: Check [email or web address] for General Surgery [facility] coverage (Please, do not use SecureChat as it is not reliable communication to operating surgeons for immediate patient care)      06/25/2020, 8:42 AM

## 2020-07-01 LAB — SURGICAL PATHOLOGY

## 2020-07-08 ENCOUNTER — Other Ambulatory Visit: Payer: Self-pay

## 2020-07-28 ENCOUNTER — Encounter: Payer: Self-pay | Admitting: Dermatology

## 2020-07-28 ENCOUNTER — Other Ambulatory Visit: Payer: Self-pay

## 2020-07-28 ENCOUNTER — Ambulatory Visit: Payer: PPO | Admitting: Dermatology

## 2020-07-28 DIAGNOSIS — B078 Other viral warts: Secondary | ICD-10-CM | POA: Diagnosis not present

## 2020-07-28 DIAGNOSIS — L821 Other seborrheic keratosis: Secondary | ICD-10-CM

## 2020-07-28 DIAGNOSIS — Z1283 Encounter for screening for malignant neoplasm of skin: Secondary | ICD-10-CM

## 2020-07-31 ENCOUNTER — Encounter: Payer: Self-pay | Admitting: Dermatology

## 2020-07-31 NOTE — Progress Notes (Signed)
° °  Follow-Up Visit   Subjective  Cory Thomas is a 69 y.o. male who presents for the following: Annual Exam (top of scalp- rough spot & right neck- rough spot).  General skin check Location: New rough spots in front of scalp and neck Duration:  Quality:  Associated Signs/Symptoms: Modifying Factors:  Severity:  Timing: Context:   Objective  Well appearing patient in no apparent distress; mood and affect are within normal limits.  A full examination was performed including scalp, head, eyes, ears, nose, lips, neck, chest, axillae, abdomen, back, buttocks, bilateral upper extremities, bilateral lower extremities, hands, feet, fingers, toes, fingernails, and toenails. All findings within normal limits unless otherwise noted below.   Assessment & Plan    Seborrheic keratosis (2) Left Temple; Anterior Mid Neck  Leave if stable  Other viral warts Right 5th Metatarsal Plantar Area  Deep bleed.  Plus application of TCA.  Encounter for screening for malignant neoplasm of skin Mid Back  Self examine twice annually, yearly dermatology exam.     I, Lavonna Monarch, MD, have reviewed all documentation for this visit.  The documentation on 07/31/20 for the exam, diagnosis, procedures, and orders are all accurate and complete.

## 2020-08-07 DIAGNOSIS — U071 COVID-19: Secondary | ICD-10-CM | POA: Diagnosis not present

## 2021-01-21 ENCOUNTER — Encounter: Payer: Self-pay | Admitting: Emergency Medicine

## 2021-01-21 ENCOUNTER — Other Ambulatory Visit: Payer: Self-pay

## 2021-01-21 ENCOUNTER — Ambulatory Visit (INDEPENDENT_AMBULATORY_CARE_PROVIDER_SITE_OTHER): Payer: PPO | Admitting: Emergency Medicine

## 2021-01-21 ENCOUNTER — Telehealth: Payer: Self-pay | Admitting: Emergency Medicine

## 2021-01-21 VITALS — BP 112/68 | HR 66 | Temp 97.9°F | Ht 70.0 in | Wt 136.0 lb

## 2021-01-21 DIAGNOSIS — Z130 Encounter for screening for diseases of the blood and blood-forming organs and certain disorders involving the immune mechanism: Secondary | ICD-10-CM | POA: Diagnosis not present

## 2021-01-21 DIAGNOSIS — Z0000 Encounter for general adult medical examination without abnormal findings: Secondary | ICD-10-CM | POA: Diagnosis not present

## 2021-01-21 DIAGNOSIS — Z13228 Encounter for screening for other metabolic disorders: Secondary | ICD-10-CM | POA: Diagnosis not present

## 2021-01-21 DIAGNOSIS — Z13220 Encounter for screening for lipoid disorders: Secondary | ICD-10-CM | POA: Diagnosis not present

## 2021-01-21 DIAGNOSIS — Z1329 Encounter for screening for other suspected endocrine disorder: Secondary | ICD-10-CM | POA: Diagnosis not present

## 2021-01-21 LAB — HEMOGLOBIN A1C: Hgb A1c MFr Bld: 5.9 % (ref 4.6–6.5)

## 2021-01-21 LAB — COMPREHENSIVE METABOLIC PANEL
ALT: 23 U/L (ref 0–53)
AST: 28 U/L (ref 0–37)
Albumin: 4.3 g/dL (ref 3.5–5.2)
Alkaline Phosphatase: 68 U/L (ref 39–117)
BUN: 24 mg/dL — ABNORMAL HIGH (ref 6–23)
CO2: 29 mEq/L (ref 19–32)
Calcium: 9.2 mg/dL (ref 8.4–10.5)
Chloride: 104 mEq/L (ref 96–112)
Creatinine, Ser: 0.92 mg/dL (ref 0.40–1.50)
GFR: 84.65 mL/min (ref >60.00)
Glucose, Bld: 104 mg/dL — ABNORMAL HIGH (ref 70–99)
Potassium: 4.4 mEq/L (ref 3.5–5.1)
Sodium: 139 mEq/L (ref 135–145)
Total Bilirubin: 0.9 mg/dL (ref 0.2–1.2)
Total Protein: 6.4 g/dL (ref 6.0–8.3)

## 2021-01-21 LAB — CBC WITH DIFFERENTIAL/PLATELET
Basophils Absolute: 0.1 10*3/uL (ref 0.0–0.1)
Basophils Relative: 1.3 % (ref 0.0–3.0)
Eosinophils Absolute: 0.1 10*3/uL (ref 0.0–0.7)
Eosinophils Relative: 2.2 % (ref 0.0–5.0)
HCT: 45 % (ref 39.0–52.0)
Hemoglobin: 15.2 g/dL (ref 13.0–17.0)
Lymphocytes Relative: 25.9 % (ref 12.0–46.0)
Lymphs Abs: 1 10*3/uL (ref 0.7–4.0)
MCHC: 33.8 g/dL (ref 30.0–36.0)
MCV: 91.8 fl (ref 78.0–100.0)
Monocytes Absolute: 0.4 10*3/uL (ref 0.1–1.0)
Monocytes Relative: 8.8 % (ref 3.0–12.0)
Neutro Abs: 2.4 10*3/uL (ref 1.4–7.7)
Neutrophils Relative %: 61.8 % (ref 43.0–77.0)
Platelets: 133 10*3/uL — ABNORMAL LOW (ref 150.0–400.0)
RBC: 4.9 Mil/uL (ref 4.22–5.81)
RDW: 13.5 % (ref 11.5–15.5)
WBC: 4 10*3/uL (ref 4.0–10.5)

## 2021-01-21 LAB — LIPID PANEL
Cholesterol: 143 mg/dL (ref 0–200)
HDL: 57.7 mg/dL (ref >39.00)
LDL Cholesterol: 76 mg/dL (ref 0–99)
NonHDL: 85.17
Total CHOL/HDL Ratio: 2
Triglycerides: 44 mg/dL (ref 0.0–149.0)
VLDL: 8.8 mg/dL (ref 0.0–40.0)

## 2021-01-21 LAB — VITAMIN D 25 HYDROXY (VIT D DEFICIENCY, FRACTURES): VITD: 79.12 ng/mL (ref 30.00–100.00)

## 2021-01-21 NOTE — Patient Instructions (Signed)

## 2021-01-21 NOTE — Progress Notes (Signed)
Cory Thomas 70 y.o.   Chief Complaint  Patient presents with  . Annual Exam    Pt would like to discuss his vitamin d3 level, omega, and blood type. Pt has questions about hiking [place].    HISTORY OF PRESENT ILLNESS: This is a 70 y.o. male here for his annual exam. Since his last visit he had surgery to remove stomach for 12/24/2019 with complete recovery.  Gastric issues resolved. Has increased walking to 4 miles per day.  Increase prebiotic's and probiotics.  Began drinking 24 ounces of cherry juice per day.  Drinks daily smoothly.  Began intermittent fasting.  40 mg before bed.  Very healthy lifestyle. Requesting vitamin D3 level, omega-3 level, and blood type.  Patient is a Restaurant manager, fast food. Blood work done last October showed slightly increased glucose with hemoglobin A1c of 5.7.  Mild elevation white blood cells and mild decrease in platelets at 133,000. He is planning to trek to [place] next November. Overall feeling well.  No complaints or any other medical concerns today.  HPI   Prior to Admission medications   Medication Sig Start Date End Date Taking? Authorizing Provider  Alpha-D-Galactosidase Satira Mccallum) TABS Take 2 tablets by mouth daily. 800 Galu   Yes [provider]  calcium carbonate (OS-CAL - DOSED IN MG OF ELEMENTAL CALCIUM) 1250 (500 Ca) MG tablet Take 1 tablet by mouth.   Yes [provider]  Cholecalciferol (VITAMIN D3) 50 MCG (2000 UT) TABS Take 4,000 Units by mouth daily.   Yes [provider]  zinc gluconate 50 MG tablet Take 25 mg by mouth daily.   Yes [provider]  ondansetron (ZOFRAN) 4 MG tablet Take 1 tablet (4 mg total) by mouth every 8 (eight) hours as needed for refractory nausea / vomiting. Patient not taking: No sig reported 06/25/20   Michael Boston, MD  traMADol (ULTRAM) 50 MG tablet Take 1 tablet (50 mg total) by mouth every 6 (six) hours as needed (mild pain). Patient not taking: No sig  reported 06/24/20   Michael Boston, MD    Allergies  Allergen Reactions  . Other     Refuses Blood    Patient Active Problem List   Diagnosis Date Noted  . Refusal of blood transfusions as patient is Jehovah's Witness 04/15/2020  . Dysphagia 04/15/2020  . Stricture and stenosis of esophagus s/p EGD dilitation May 2021 04/15/2020  . 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 Allergy   . Anxiety    per pt after father died  . Asthma    age 32  . Cancer ([place])    skin on arms and legs years ago rmoved over 10 yrs ago  . Gastritis    years ago  . Osteoporosis   . Refusal of blood product     Past Surgical History:  Procedure Laterality Date  . CLOSED REDUCTION WRIST FRACTURE     70 years old  . ESOPHAGOGASTRODUODENOSCOPY N/A 03/05/2020   Procedure: ESOPHAGOGASTRODUODENOSCOPY (EGD);  Surgeon: Milus Banister, MD;  Location: [facility];  Service: Endoscopy;  Laterality: N/A;  . EUS N/A 03/05/2020   Procedure: UPPER ENDOSCOPIC ULTRASOUND (EUS) LINEAR;  Surgeon: Milus Banister, MD;  Location: [facility];  Service: Endoscopy;  Laterality: N/A;  . FINE NEEDLE ASPIRATION N/A 03/05/2020   Procedure: FINE NEEDLE ASPIRATION (FNA) LINEAR;  Surgeon: Milus Banister, MD;  Location:  [facility];  Service: Endoscopy;  Laterality: N/A;  . POLYPECTOMY  12/2018   colon  . TONSILLECTOMY    . WISDOM TOOTH EXTRACTION      Social History   Socioeconomic History  . Marital status: Married    Spouse name: Not on file  . Number of children: Not on file  . Years of education: Not on file  . Highest education level: Not on file  Occupational History  . Occupation: Probation officer  Tobacco Use  . Smoking status: Never Smoker  . Smokeless tobacco: Never Used  Vaping Use  . Vaping Use: Never used  Substance and Sexual Activity  . Alcohol use: Yes    Alcohol/week: 3.0 standard drinks     Types: 3 Glasses of wine per week    Comment: 1-2 bottles of beer/week, red wine  . Drug use: Never  . Sexual activity: Not on file  Other Topics Concern  . Not on file  Social History Narrative   Jehovah's Witness   From [place] to hike   Social Determinants of Health   Financial Resource Strain: Not on file  Food Insecurity: Not on file  Transportation Needs: Not on file  Physical Activity: Not on file  Stress: Not on file  Social Connections: Not on file  Intimate Partner Violence: Not on file    Family History  Problem Relation Age of Onset  . Dementia Father   . Colon cancer Neg Hx   . Colon polyps Neg Hx   . Esophageal cancer Neg Hx   . Rectal cancer Neg Hx   . Stomach cancer Neg Hx      Review of Systems  Constitutional: Negative.  Negative for chills and fever.  HENT: Negative.  Negative for congestion and sore throat.   Respiratory: Negative.  Negative for cough and shortness of breath.   Cardiovascular: Negative.  Negative for chest pain and palpitations.  Gastrointestinal: Negative.  Negative for abdominal pain, blood in stool, diarrhea, melena, nausea and vomiting.  Genitourinary: Negative.   Musculoskeletal: Negative.  Negative for joint pain and myalgias.  Skin: Negative.  Negative for rash.  Neurological: Negative.  Negative for dizziness and headaches.  All other systems reviewed and are negative.   Today's Vitals   01/21/21 1048  BP: 112/68  Pulse: 66  Temp: 97.9 F (36.6 C)  TempSrc: Oral  SpO2: 99%  Weight: 136 lb (61.7 kg)  Height: 5\' 10"  (1.778 m)   Body mass index is 19.51 kg/m. Wt Readings from Last 3 Encounters:  01/21/21 136 lb (61.7 kg)  06/24/20 140 lb 6 oz (63.7 kg)  06/16/20 140 lb 6 oz (63.7 kg)    Physical Exam Vitals reviewed.  Constitutional:      Appearance: Normal appearance.  HENT:     Head: Normocephalic.     Right Ear: Tympanic membrane, ear canal and external ear normal.     Left Ear: Tympanic  membrane, ear canal and external ear normal.  Eyes:     Extraocular Movements: Extraocular movements intact.     Conjunctiva/sclera: Conjunctivae normal.     Pupils: Pupils are equal, round, and reactive to light.  Cardiovascular:     Rate and Rhythm: Normal rate and regular rhythm.     Pulses: Normal pulses.     Heart sounds: Normal heart sounds.  Pulmonary:     Effort: Pulmonary effort is normal.     Breath sounds: Normal breath sounds.  Musculoskeletal:  General: Normal range of motion.     Cervical back: Normal range of motion and neck supple. No tenderness.     Right lower leg: No edema.     Left lower leg: No edema.  Lymphadenopathy:     Cervical: No cervical adenopathy.  Skin:    General: Skin is warm and dry.     Capillary Refill: Capillary refill takes less than 2 seconds.  Neurological:     General: No focal deficit present.     Mental Status: He is alert and oriented to person, place, and time.  Psychiatric:        Mood and Affect: Mood normal.        Behavior: Behavior normal.      ASSESSMENT & PLAN: Cory Thomas was seen today for annual exam.  Diagnoses and all orders for this visit:  Routine general medical examination at a health care facility  Screening for deficiency anemia -     CBC with Differential/Platelet  Screening for lipoid disorders -     Lipid panel  Screening for endocrine, metabolic and immunity disorder -     Comprehensive metabolic panel -     VITAMIN D 25 Hydroxy (Vit-D Deficiency, Fractures) -     Hemoglobin A1c -     Vitamin D2,D3 Panel -     ABO AND RH    Modifiable risk factors discussed with patient. Anticipatory guidance according to his age provided. The following topics were discussed: Smoking Diet and nutrition Benefits of exercise Cancer screening and review of colonoscopy done in 2019 which showed 1 polyp and diverticulosis. Most recent upper endoscopy report reviewed with patient along with surgical report last  October. Vaccinations Cardiovascular risk assessment Mental health including depression and anxiety Fall and accident prevention   Patient Instructions     Health Maintenance, Male Adopting a healthy lifestyle and getting preventive care are important in promoting health and wellness. Ask your health care provider about:  The right schedule for you to have regular tests and exams.  Things you can do on your own to prevent diseases and keep yourself healthy. What should I know about diet, weight, and exercise? Eat a healthy diet  Eat a diet that includes plenty of vegetables, fruits, low-fat dairy products, and lean protein.  Do not eat a lot of foods that are high in solid fats, added sugars, or sodium.   Maintain a healthy weight Body mass index (BMI) is a measurement that can be used to identify possible weight problems. It estimates body fat based on height and weight. Your health care provider can help determine your BMI and help you achieve or maintain a healthy weight. Get regular exercise Get regular exercise. This is one of the most important things you can do for your health. Most adults should:  Exercise for at least 150 minutes each week. The exercise should increase your heart rate and make you sweat (moderate-intensity exercise).  Do strengthening exercises at least twice a week. This is in addition to the moderate-intensity exercise.  Spend less time sitting. Even light physical activity can be beneficial. Watch cholesterol and blood lipids Have your blood tested for lipids and cholesterol at 70 years of age, then have this test every 5 years. You may need to have your cholesterol levels checked more often if:  Your lipid or cholesterol levels are high.  You are older than 70 years of age.  You are at high risk for heart disease. What should I know  about cancer screening? Many types of cancers can be detected early and may often be prevented. Depending on your  health history and family history, you may need to have cancer screening at various ages. This may include screening for:  Colorectal cancer.  Prostate cancer.  Skin cancer.  Lung cancer. What should I know about heart disease, diabetes, and high blood pressure? Blood pressure and heart disease  High blood pressure causes heart disease and increases the risk of stroke. This is more likely to develop in people who have high blood pressure readings, are of African descent, or are overweight.  Talk with your health care provider about your target blood pressure readings.  Have your blood pressure checked: ? Every 3-5 years if you are 89-77 years of age. ? Every year if you are 9 years old or older.  If you are between the ages of 29 and 37 and are a current or former smoker, ask your health care provider if you should have a one-time screening for abdominal aortic aneurysm (AAA). Diabetes Have regular diabetes screenings. This checks your fasting blood sugar level. Have the screening done:  Once every three years after age 57 if you are at a normal weight and have a low risk for diabetes.  More often and at a younger age if you are overweight or have a high risk for diabetes. What should I know about preventing infection? Hepatitis B If you have a higher risk for hepatitis B, you should be screened for this virus. Talk with your health care provider to find out if you are at risk for hepatitis B infection. Hepatitis C Blood testing is recommended for:  Everyone born from [birth year] through 1965.  Anyone with known risk factors for hepatitis C. Sexually transmitted infections (STIs)  You should be screened each year for STIs, including gonorrhea and chlamydia, if: ? You are sexually active and are younger than 70 years of age. ? You are older than 70 years of age and your health care provider tells you that you are at risk for this type of infection. ? Your sexual activity has changed  since you were last screened, and you are at increased risk for chlamydia or gonorrhea. Ask your health care provider if you are at risk.  Ask your health care provider about whether you are at high risk for HIV. Your health care provider may recommend a prescription medicine to help prevent HIV infection. If you choose to take medicine to prevent HIV, you should first get tested for HIV. You should then be tested every 3 months for as long as you are taking the medicine. Follow these instructions at home: Lifestyle  Do not use any products that contain nicotine or tobacco, such as cigarettes, e-cigarettes, and chewing tobacco. If you need help quitting, ask your health care provider.  Do not use street drugs.  Do not share needles.  Ask your health care provider for help if you need support or information about quitting drugs. Alcohol use  Do not drink alcohol if your health care provider tells you not to drink.  If you drink alcohol: ? Limit how much you have to 0-2 drinks a day. ? Be aware of how much alcohol is in your drink. In the [place] one drink equals one 12 oz bottle of beer (355 mL), one 5 oz glass of wine (148 mL), or one 1 oz glass of hard liquor (44 mL). General instructions  Schedule regular health, dental, and eye  exams.  Stay current with your vaccines.  Tell your health care provider if: ? You often feel depressed. ? You have ever been abused or do not feel safe at home. Summary  Adopting a healthy lifestyle and getting preventive care are important in promoting health and wellness.  Follow your health care provider's instructions about healthy diet, exercising, and getting tested or screened for diseases.  Follow your health care provider's instructions on monitoring your cholesterol and blood pressure. This information is not intended to replace advice given to you by your health care provider. Make sure you discuss any questions you have with your health care  provider. Document Revised: 08/22/2018 Document Reviewed: 08/22/2018 Elsevier Patient Education  2021 [place], [place] [facility]

## 2021-01-21 NOTE — Telephone Encounter (Signed)
Patient looked on his AVS/Med list and said that he is no longer taking ondansetron (ZOFRAN) 4 MG tablet or traMADol (ULTRAM) 50 MG tablet. He was wondering if they could be removed. He also said that he takes 6,000 units of the Vitamin D3 instead of 4,00 units. Please advise

## 2021-01-22 LAB — ABO AND RH

## 2021-01-22 NOTE — Telephone Encounter (Signed)
Corrections to pt medication list have been updated per pt request.

## 2021-02-05 NOTE — Addendum Note (Signed)
Addended by: Alfredia Ferguson A on: 02/05/2021 09:03 AM   Modules accepted: Orders

## 2021-04-23 ENCOUNTER — Other Ambulatory Visit: Payer: Self-pay

## 2021-04-23 ENCOUNTER — Ambulatory Visit (INDEPENDENT_AMBULATORY_CARE_PROVIDER_SITE_OTHER): Payer: PPO

## 2021-04-23 DIAGNOSIS — Z23 Encounter for immunization: Secondary | ICD-10-CM | POA: Diagnosis not present

## 2021-04-23 NOTE — Progress Notes (Signed)
Pt here for 1st Shingles injection   Shingles vaccine given, and pt tolerated injection well.  Next Shingles injection scheduled for 08/23/21.

## 2021-05-28 ENCOUNTER — Encounter: Payer: Self-pay | Admitting: Emergency Medicine

## 2021-05-29 ENCOUNTER — Ambulatory Visit: Payer: PPO | Admitting: *Deleted

## 2021-05-29 ENCOUNTER — Encounter: Payer: Self-pay | Admitting: *Deleted

## 2021-05-29 NOTE — Telephone Encounter (Signed)
None whatsoever as far as I know.

## 2021-06-30 ENCOUNTER — Telehealth: Payer: Self-pay | Admitting: Emergency Medicine

## 2021-06-30 NOTE — Telephone Encounter (Signed)
Left message for patient to call me back at [phone] to schedule Medicare Annual Wellness Visit   Last AWV  01/14/19  Please schedule at anytime with [facility] if patient calls the office back.    40 Minutes appointment   Any questions, please call me at [phone]

## 2021-07-14 ENCOUNTER — Other Ambulatory Visit: Payer: Self-pay

## 2021-07-14 ENCOUNTER — Ambulatory Visit (INDEPENDENT_AMBULATORY_CARE_PROVIDER_SITE_OTHER): Payer: PPO

## 2021-07-14 DIAGNOSIS — Z0000 Encounter for general adult medical examination without abnormal findings: Secondary | ICD-10-CM

## 2021-07-14 NOTE — Patient Instructions (Signed)
Cory Thomas , Thank you for taking time to come for your Medicare Wellness Visit. I appreciate your ongoing commitment to your health goals. Please review the following plan we discussed and let me know if I can assist you in the future.   Screening recommendations/referrals: Colonoscopy: 03/29/2018; due every 5 years Recommended yearly ophthalmology/optometry visit for glaucoma screening and checkup Recommended yearly dental visit for hygiene and checkup  Vaccinations: Influenza vaccine: 06/16/2021 Pneumococcal vaccine: 09/10/2016, 06/16/2021 Tdap vaccine: 06/17/2015; due every 10 years Shingles vaccine: 04/23/2021; need second dose within 6 weeks-6 months   Covid-19: 10/04/2019, 11/03/2019, 06/08/2020, 06/09/2021  Advanced directives: Yes; documents on file.  Conditions/risks identified: Yes; Client understands the importance of follow-up with providers by attending scheduled visits and discussed goals to eat healthier, increase physical activity, exercise the brain, socialize more, get enough sleep and make time for laughter.  Next appointment: Please schedule your next Medicare Wellness Visit with your Nurse Health Advisor in 1 year by calling [phone].  Preventive Care 70 Years and Older, Male Preventive care refers to lifestyle choices and visits with your health care provider that can promote health and wellness. What does preventive care include? A yearly physical exam. This is also called an annual well check. Dental exams once or twice a year. Routine eye exams. Ask your health care provider how often you should have your eyes checked. Personal lifestyle choices, including: Daily care of your teeth and gums. Regular physical activity. Eating a healthy diet. Avoiding tobacco and drug use. Limiting alcohol use. Practicing safe sex. Taking low doses of aspirin every day. Taking vitamin and mineral supplements as recommended by your health care provider. What happens during an  annual well check? The services and screenings done by your health care provider during your annual well check will depend on your age, overall health, lifestyle risk factors, and family history of disease. Counseling  Your health care provider may ask you questions about your: Alcohol use. Tobacco use. Drug use. Emotional well-being. Home and relationship well-being. Sexual activity. Eating habits. History of falls. Memory and ability to understand (cognition). Work and work Statistician. Screening  You may have the following tests or measurements: Height, weight, and BMI. Blood pressure. Lipid and cholesterol levels. These may be checked every 5 years, or more frequently if you are over 43 years old. Skin check. Lung cancer screening. You may have this screening every year starting at age 47 if you have a 30-pack-year history of smoking and currently smoke or have quit within the past 15 years. Fecal occult blood test (FOBT) of the stool. You may have this test every year starting at age 72. Flexible sigmoidoscopy or colonoscopy. You may have a sigmoidoscopy every 5 years or a colonoscopy every 10 years starting at age 31. Prostate cancer screening. Recommendations will vary depending on your family history and other risks. Hepatitis C blood test. Hepatitis B blood test. Sexually transmitted disease (STD) testing. Diabetes screening. This is done by checking your blood sugar (glucose) after you have not eaten for a while (fasting). You may have this done every 1-3 years. Abdominal aortic aneurysm (AAA) screening. You may need this if you are a current or former smoker. Osteoporosis. You may be screened starting at age 23 if you are at high risk. Talk with your health care provider about your test results, treatment options, and if necessary, the need for more tests. Vaccines  Your health care provider may recommend certain vaccines, such as: Influenza vaccine. This is recommended  every year. Tetanus, diphtheria, and acellular pertussis (Tdap, Td) vaccine. You may need a Td booster every 10 years. Zoster vaccine. You may need this after age 44. Pneumococcal 13-valent conjugate (PCV13) vaccine. One dose is recommended after age 59. Pneumococcal polysaccharide (PPSV23) vaccine. One dose is recommended after age 60. Talk to your health care provider about which screenings and vaccines you need and how often you need them. This information is not intended to replace advice given to you by your health care provider. Make sure you discuss any questions you have with your health care provider. Document Released: 09/25/2015 Document Revised: 05/18/2016 Document Reviewed: 06/30/2015 Elsevier Interactive Patient Education  2017 [place] Prevention in the Home Falls can cause injuries. They can happen to people of all ages. There are many things you can do to make your home safe and to help prevent falls. What can I do on the outside of my home? Regularly fix the edges of walkways and driveways and fix any cracks. Remove anything that might make you trip as you walk through a door, such as a raised step or threshold. Trim any bushes or trees on the path to your home. Use bright outdoor lighting. Clear any walking paths of anything that might make someone trip, such as rocks or tools. Regularly check to see if handrails are loose or broken. Make sure that both sides of any steps have handrails. Any raised decks and porches should have guardrails on the edges. Have any leaves, snow, or ice cleared regularly. Use sand or salt on walking paths during winter. Clean up any spills in your garage right away. This includes oil or grease spills. What can I do in the bathroom? Use night lights. Install grab bars by the toilet and in the tub and shower. Do not use towel bars as grab bars. Use non-skid mats or decals in the tub or shower. If you need to sit down in the shower,  use a plastic, non-slip stool. Keep the floor dry. Clean up any water that spills on the floor as soon as it happens. Remove soap buildup in the tub or shower regularly. Attach bath mats securely with double-sided non-slip rug tape. Do not have throw rugs and other things on the floor that can make you trip. What can I do in the bedroom? Use night lights. Make sure that you have a light by your bed that is easy to reach. Do not use any sheets or blankets that are too big for your bed. They should not hang down onto the floor. Have a firm chair that has side arms. You can use this for support while you get dressed. Do not have throw rugs and other things on the floor that can make you trip. What can I do in the kitchen? Clean up any spills right away. Avoid walking on wet floors. Keep items that you use a lot in easy-to-reach places. If you need to reach something above you, use a strong step stool that has a grab bar. Keep electrical cords out of the way. Do not use floor polish or wax that makes floors slippery. If you must use wax, use non-skid floor wax. Do not have throw rugs and other things on the floor that can make you trip. What can I do with my stairs? Do not leave any items on the stairs. Make sure that there are handrails on both sides of the stairs and use them. Fix handrails that are broken or  loose. Make sure that handrails are as long as the stairways. Check any carpeting to make sure that it is firmly attached to the stairs. Fix any carpet that is loose or worn. Avoid having throw rugs at the top or bottom of the stairs. If you do have throw rugs, attach them to the floor with carpet tape. Make sure that you have a light switch at the top of the stairs and the bottom of the stairs. If you do not have them, ask someone to add them for you. What else can I do to help prevent falls? Wear shoes that: Do not have high heels. Have rubber bottoms. Are comfortable and fit you  well. Are closed at the toe. Do not wear sandals. If you use a stepladder: Make sure that it is fully opened. Do not climb a closed stepladder. Make sure that both sides of the stepladder are locked into place. Ask someone to hold it for you, if possible. Clearly mark and make sure that you can see: Any grab bars or handrails. First and last steps. Where the edge of each step is. Use tools that help you move around (mobility aids) if they are needed. These include: Canes. Walkers. Scooters. Crutches. Turn on the lights when you go into a dark area. Replace any light bulbs as soon as they burn out. Set up your furniture so you have a clear path. Avoid moving your furniture around. If any of your floors are uneven, fix them. If there are any pets around you, be aware of where they are. Review your medicines with your doctor. Some medicines can make you feel dizzy. This can increase your chance of falling. Ask your doctor what other things that you can do to help prevent falls. This information is not intended to replace advice given to you by your health care provider. Make sure you discuss any questions you have with your health care provider. Document Released: 06/25/2009 Document Revised: 02/04/2016 Document Reviewed: 10/03/2014 Elsevier Interactive Patient Education  2017 [employer].

## 2021-07-14 NOTE — Progress Notes (Signed)
I connected with Cory Thomas today by telephone and verified that I am speaking with the correct person using two identifiers. Location patient: home Location provider: work Persons participating in the virtual visit: patient, provider.   I discussed the limitations, risks, security and privacy concerns of performing an evaluation and management service by telephone and the availability of in person appointments. I also discussed with the patient that there may be a patient responsible charge related to this service. The patient expressed understanding and verbally consented to this telephonic visit.    Interactive audio and video telecommunications were attempted between this provider and patient, however failed, due to patient having technical difficulties OR patient did not have access to video capability.  We continued and completed visit with audio only.  Some vital signs may be absent or patient reported.   Time Spent with patient on telephone encounter: 40 minutes  Subjective:   Cory Thomas is a 70 y.o. male who presents for Medicare Annual/Subsequent preventive examination.  Review of Systems     Cardiac Risk Factors include: advanced age (>56men, >13 women);male gender     Objective:    There were no vitals filed for this visit. There is no height or weight on file to calculate BMI.  Advanced Directives 07/14/2021 06/24/2020 06/16/2020 05/04/2020 03/05/2020 01/14/2019 01/14/2019  Does Patient Have a Medical Advance Directive? Yes Yes Yes Yes Yes Yes No  Type of Advance Directive - [place];Living will - [place];Living will [place];Living will Living will -  Does patient want to make changes to medical advance directive? No - Patient declined No - Patient declined Yes (MAU/Ambulatory/Procedural Areas - Information given) - - No - Patient declined -  Copy of [place] in Chart? - Yes - validated  most recent copy scanned in chart (See row information) - Yes - validated most recent copy scanned in chart (See row information) Yes - validated most recent copy scanned in chart (See row information) - -    Current Medications (verified) Outpatient Encounter Medications as of 07/14/2021  Medication Sig   Alpha-D-Galactosidase (BEANO) TABS Take 2 tablets by mouth daily. 800 Galu   calcium carbonate (OS-CAL - DOSED IN MG OF ELEMENTAL CALCIUM) 1250 (500 Ca) MG tablet Take 1 tablet by mouth.   Cholecalciferol (VITAMIN D3) 50 MCG (2000 UT) TABS Take 6,000 Units by mouth daily.   zinc gluconate 50 MG tablet Take 25 mg by mouth daily.   [DISCONTINUED] ondansetron (ZOFRAN) 4 MG tablet Take 1 tablet (4 mg total) by mouth every 8 (eight) hours as needed for refractory nausea / vomiting. (Patient not taking: No sig reported)   [DISCONTINUED] traMADol (ULTRAM) 50 MG tablet Take 1 tablet (50 mg total) by mouth every 6 (six) hours as needed (mild pain). (Patient not taking: Reported on 01/22/2021)   No facility-administered encounter medications on file as of 07/14/2021.    Allergies (verified) Other   History: Past Medical History:  Diagnosis Date   Allergy    Anxiety    per pt after father died   Asthma    age 72   Cancer ([place])    skin on arms and legs years ago rmoved over 10 yrs ago   Gastritis    years ago   Osteoporosis    Refusal of blood product    Past Surgical History:  Procedure Laterality Date   CLOSED REDUCTION WRIST FRACTURE     70 years old  ESOPHAGOGASTRODUODENOSCOPY N/A 03/05/2020   Procedure: ESOPHAGOGASTRODUODENOSCOPY (EGD);  Surgeon: Milus Banister, MD;  Location: [facility];  Service: Endoscopy;  Laterality: N/A;   EUS N/A 03/05/2020   Procedure: UPPER ENDOSCOPIC ULTRASOUND (EUS) LINEAR;  Surgeon: Milus Banister, MD;  Location: [facility];  Service: Endoscopy;  Laterality: N/A;   FINE NEEDLE ASPIRATION N/A 03/05/2020   Procedure: FINE NEEDLE ASPIRATION (FNA)  LINEAR;  Surgeon: Milus Banister, MD;  Location: [facility];  Service: Endoscopy;  Laterality: N/A;   POLYPECTOMY  12/2018   colon   TONSILLECTOMY     WISDOM TOOTH EXTRACTION     Family History  Problem Relation Age of Onset   Dementia Father    Colon cancer Neg Hx    Colon polyps Neg Hx    Esophageal cancer Neg Hx    Rectal cancer Neg Hx    Stomach cancer Neg Hx    Social History   Socioeconomic History   Marital status: Married    Spouse name: Not on file   Number of children: Not on file   Years of education: Not on file   Highest education level: Not on file  Occupational History   Occupation: Air duct cleaning  Tobacco Use   Smoking status: Never   Smokeless tobacco: Never  Vaping Use   Vaping Use: Never used  Substance and Sexual Activity   Alcohol use: Yes    Alcohol/week: 3.0 standard drinks    Types: 3 Glasses of wine per week    Comment: 1-2 bottles of beer/week, red wine   Drug use: Never   Sexual activity: Not on file  Other Topics Concern   Not on file  Social History Narrative   J[employer]'s Witness   From [place] to hike   Social Determinants of Health   Financial Resource Strain: Low Risk    Difficulty of Paying Living Expenses: Not hard at all  Food Insecurity: No Food Insecurity   Worried About Charity fundraiser in the Last Year: Never true   Arboriculturist in the Last Year: Never true  Transportation Needs: No Transportation Needs   Lack of Transportation (Medical): No   Lack of Transportation (Non-Medical): No  Physical Activity: Sufficiently Active   Days of Exercise per Week: 7 days   Minutes of Exercise per Session: 60 min  Stress: No Stress Concern Present   Feeling of Stress : Not at all  Social Connections: Socially Integrated   Frequency of Communication with Friends and Family: More than three times a week   Frequency of Social Gatherings with Friends and Family: More than three times a week   Attends Religious  Services: More than 4 times per year   Active Member of [employer] or Organizations: Yes   Attends Music therapist: More than 4 times per year   Marital Status: Married    Tobacco Counseling Counseling given: Not Answered   Clinical Intake:  Pre-visit preparation completed: Yes  Pain : No/denies pain     Nutritional Risks: None Diabetes: No  How often do you need to have someone help you when you read instructions, pamphlets, or other written materials from your doctor or pharmacy?: 1 - Never What is the last grade level you completed in school?: [place]; [employer] Estate App[employer] Degree from [employer]  Diabetic? no  Interpreter Needed?: No  Information entered by :: Lisette Abu, LPN   Activities of Daily Living In your present state  of health, do you have any difficulty performing the following activities: 07/14/2021  Hearing? N  Vision? N  Difficulty concentrating or making decisions? N  Walking or climbing stairs? N  Dressing or bathing? N  Doing errands, shopping? N  Preparing Food and eating ? N  Using the Toilet? N  In the past six months, have you accidently leaked urine? N  Do you have problems with loss of bowel control? N  Managing your Medications? N  Managing your Finances? N  Housekeeping or managing your Housekeeping? N  Some recent data might be hidden    Patient Care Team: Horald Pollen, MD as PCP - General (Internal Medicine) Michael Boston, MD as Consulting Physician (General Surgery) Armbruster, [name], MD as Consulting Physician (Gastroenterology)  Indicate any recent Medical Services you may have received from other than [facility] providers in the past year (date may be approximate).     Assessment:   This is a routine wellness examination for [place].  Hearing/Vision screen Hearing Screening - Comments:: Patient denied any hearing difficulty.   No hearing aids.  Vision Screening - Comments:: Patient wears  corrective bifocal glasses.  Eye exam done annually.  Dietary issues and exercise activities discussed: Current Exercise Habits: Home exercise routine, Type of exercise: walking;Other - see comments (walk 5 miles per day, gym 3 times a week, 10-12 mile hike in the woods; 15,000k steps), Time (Minutes): 60, Frequency (Times/Week): 7, Weekly Exercise (Minutes/Week): 420, Intensity: Intense   Goals Addressed   None   Depression Screen PHQ 2/9 Scores 07/14/2021 01/21/2021 11/05/2019 01/14/2019 01/09/2018 12/06/2017  PHQ - 2 Score 0 0 0 0 0 0    Fall Risk Fall Risk  07/14/2021 01/21/2021 11/05/2019 01/14/2019 01/09/2018  Falls in the past year? 0 0 0 0 No  Number falls in past yr: 0 0 - 0 -  Injury with Fall? 0 0 - 0 -  Risk for fall due to : No Fall Risks - - - -  Follow up Falls evaluation completed - Falls evaluation completed - -    FALL RISK PREVENTION PERTAINING TO THE HOME:  Any stairs in or around the home? Yes  If so, are there any without handrails? No  Home free of loose throw rugs in walkways, pet beds, electrical cords, etc? Yes  Adequate lighting in your home to reduce risk of falls? Yes   ASSISTIVE DEVICES UTILIZED TO PREVENT FALLS:  Life alert? No  Use of a cane, walker or w/c? No  Grab bars in the bathroom? Yes  Shower chair or bench in shower? No  Elevated toilet seat or a handicapped toilet? No   TIMED UP AND GO:  Was the test performed? No .  Length of time to ambulate 10 feet: n/a sec.   Gait steady and fast without use of assistive device  Cognitive Function: Normal cognitive status assessed by direct observation by this Nurse Health Advisor. No abnormalities found.       6CIT Screen 01/14/2019  What Year? 0 points  What month? 0 points  What time? 0 points  Count back from 20 0 points  Months in reverse 0 points  Repeat phrase 0 points  Total Score 0    Immunizations Immunization History  Administered Date(s) Administered   Fluad Quad(high Dose 65+)  07/08/2019, 06/16/2021   Hepatitis A, Adult 12/06/2017   Influenza-Unspecified 06/17/2015   PFIZER(Purple Top)SARS-COV-2 Vaccination 10/04/2019, 11/03/2019, 06/08/2020   Pfizer Covid-19 Vaccine Bivalent Booster 80yrs & up 05/20/2021  Pneumococcal Conjugate-13 09/10/2016   Tdap 06/17/2015   Zoster Recombinat (Shingrix) 04/23/2021    TDAP status: Up to date  Flu Vaccine status: Up to date  Pneumococcal vaccine status: Up to date  Covid-19 vaccine status: Completed vaccines  Qualifies for Shingles Vaccine? Yes   Zostavax completed No   Shingrix Completed?: No.    Education has been provided regarding the importance of this vaccine. Patient has been advised to call insurance company to determine out of pocket expense if they have not yet received this vaccine. Advised may also receive vaccine at local pharmacy or Health Dept. Verbalized acceptance and understanding.  Screening Tests Health Maintenance  Topic Date Due   Zoster Vaccines- Shingrix (2 of 2) 06/18/2021   COLONOSCOPY (Pts 45-64yrs Insurance coverage will need to be confirmed)  03/30/2023   TETANUS/TDAP  06/16/2025   Pneumonia Vaccine 26+ Years old  Completed   INFLUENZA VACCINE  Completed   COVID-19 Vaccine  Completed   Hepatitis C Screening  Completed   HPV VACCINES  Aged Out    Health Maintenance  Health Maintenance Due  Topic Date Due   Zoster Vaccines- Shingrix (2 of 2) 06/18/2021    Colorectal cancer screening: Type of screening: Colonoscopy. Completed 03/29/2018. Repeat every 5 years  Lung Cancer Screening: (Low Dose CT Chest recommended if Age 35-80 years, 30 pack-year currently smoking OR have quit w/in 15years.) does not qualify.   Lung Cancer Screening Referral: no  Additional Screening:  Hepatitis C Screening: does qualify; Completed yes  Vision Screening: Recommended annual ophthalmology exams for early detection of glaucoma and other disorders of the eye. Is the patient up to date with their  annual eye exam?  Yes  Who is the provider or what is the name of the office in which the patient attends annual eye exams? [facility] If pt is not established with a provider, would they like to be referred to a provider to establish care? No .   Dental Screening: Recommended annual dental exams for proper oral hygiene  Community Resource Referral / Chronic Care Management: CRR required this visit?  No   CCM required this visit?  No      Plan:     I have personally reviewed and noted the following in the patient's chart:   Medical and social history Use of alcohol, tobacco or illicit drugs  Current medications and supplements including opioid prescriptions. Patient is not currently taking opioid prescriptions. Functional ability and status Nutritional status Physical activity Advanced directives List of other physicians Hospitalizations, surgeries, and ER visits in previous 12 months Vitals Screenings to include cognitive, depression, and falls Referrals and appointments  In addition, I have reviewed and discussed with patient certain preventive protocols, quality metrics, and best practice recommendations. A written personalized care plan for preventive services as well as general preventive health recommendations were provided to patient.     Sheral Flow, LPN   [id number]   Nurse Notes:  Patient is cogitatively intact. There were no vitals filed for this visit. There is no height or weight on file to calculate BMI. Patient stated that he has no issues with gait or balance; does not use any assistive devices.

## 2021-07-28 ENCOUNTER — Ambulatory Visit: Payer: PPO | Admitting: Dermatology

## 2021-08-23 ENCOUNTER — Ambulatory Visit: Payer: PPO

## 2021-08-27 ENCOUNTER — Encounter: Payer: Self-pay | Admitting: Emergency Medicine

## 2021-08-27 DIAGNOSIS — H3554 Dystrophies primarily involving the retinal pigment epithelium: Secondary | ICD-10-CM | POA: Diagnosis not present

## 2021-08-27 DIAGNOSIS — H25813 Combined forms of age-related cataract, bilateral: Secondary | ICD-10-CM | POA: Diagnosis not present

## 2021-08-27 DIAGNOSIS — H35363 Drusen (degenerative) of macula, bilateral: Secondary | ICD-10-CM | POA: Diagnosis not present

## 2021-08-27 DIAGNOSIS — H524 Presbyopia: Secondary | ICD-10-CM | POA: Diagnosis not present

## 2021-08-27 DIAGNOSIS — H02831 Dermatochalasis of right upper eyelid: Secondary | ICD-10-CM | POA: Diagnosis not present

## 2021-08-31 ENCOUNTER — Encounter: Payer: Self-pay | Admitting: Dermatology

## 2021-08-31 ENCOUNTER — Ambulatory Visit: Payer: PPO | Admitting: Dermatology

## 2021-08-31 ENCOUNTER — Other Ambulatory Visit: Payer: Self-pay

## 2021-08-31 DIAGNOSIS — D1801 Hemangioma of skin and subcutaneous tissue: Secondary | ICD-10-CM | POA: Diagnosis not present

## 2021-08-31 DIAGNOSIS — Z1283 Encounter for screening for malignant neoplasm of skin: Secondary | ICD-10-CM | POA: Diagnosis not present

## 2021-08-31 DIAGNOSIS — L821 Other seborrheic keratosis: Secondary | ICD-10-CM | POA: Diagnosis not present

## 2021-09-10 ENCOUNTER — Encounter: Payer: Self-pay | Admitting: Emergency Medicine

## 2021-09-12 HISTORY — PX: OTHER SURGICAL HISTORY: SHX169

## 2021-09-17 ENCOUNTER — Other Ambulatory Visit: Payer: Self-pay

## 2021-09-17 ENCOUNTER — Ambulatory Visit (INDEPENDENT_AMBULATORY_CARE_PROVIDER_SITE_OTHER): Payer: PPO | Admitting: *Deleted

## 2021-09-17 DIAGNOSIS — Z23 Encounter for immunization: Secondary | ICD-10-CM

## 2021-09-17 NOTE — Progress Notes (Signed)
Pls cosign for Shingrix inj../lmb  

## 2021-09-23 ENCOUNTER — Encounter: Payer: Self-pay | Admitting: Dermatology

## 2021-09-23 NOTE — Progress Notes (Signed)
° °  Follow-Up Visit   Subjective  Cory Thomas is a 71 y.o. male who presents for the following: Annual Exam (No new concerns).  General skin examination Location:  Duration:  Quality:  Associated Signs/Symptoms: Modifying Factors:  Severity:  Timing: Context:   Objective  Well appearing patient in no apparent distress; mood and affect are within normal limits. General skin examination: No atypical pigmented lesions or nonmelanoma skin cancer.  Stuck-on, waxy papules and plaques.  Brown 3 to 8 mm flattopped textured papules, typical dermoscopy  Abdomen (Lower Torso, Anterior) Multiple 1 mm smooth red dermal papules     A full examination was performed including scalp, head, eyes, ears, nose, lips, neck, chest, axillae, abdomen, back, buttocks, bilateral upper extremities, bilateral lower extremities, hands, feet, fingers, toes, fingernails, and toenails. All findings within normal limits unless otherwise noted below.  Is beneath undergarments not fully examined.   Assessment & Plan    Screening exam for skin cancer  Annual skin examination, patient encouraged to self examine twice annually.  Continue ultraviolet protection.  Seborrheic keratosis  Benign, ok to leave if stable  Cherry angioma Abdomen (Lower Torso, Anterior)  Benign, ok to leave       I, Lavonna Monarch, MD, have reviewed all documentation for this visit.  The documentation on 09/23/21 for the exam, diagnosis, procedures, and orders are all accurate and complete.

## 2021-11-15 ENCOUNTER — Encounter: Payer: Self-pay | Admitting: Emergency Medicine

## 2021-11-15 ENCOUNTER — Ambulatory Visit (INDEPENDENT_AMBULATORY_CARE_PROVIDER_SITE_OTHER): Payer: PPO | Admitting: Emergency Medicine

## 2021-11-15 ENCOUNTER — Other Ambulatory Visit: Payer: Self-pay

## 2021-11-15 ENCOUNTER — Ambulatory Visit (INDEPENDENT_AMBULATORY_CARE_PROVIDER_SITE_OTHER): Payer: PPO

## 2021-11-15 VITALS — BP 114/62 | HR 62 | Ht 70.0 in | Wt 143.0 lb

## 2021-11-15 DIAGNOSIS — M25511 Pain in right shoulder: Secondary | ICD-10-CM

## 2021-11-15 DIAGNOSIS — G8929 Other chronic pain: Secondary | ICD-10-CM | POA: Diagnosis not present

## 2021-11-15 DIAGNOSIS — L84 Corns and callosities: Secondary | ICD-10-CM | POA: Diagnosis not present

## 2021-11-15 DIAGNOSIS — K222 Esophageal obstruction: Secondary | ICD-10-CM | POA: Diagnosis not present

## 2021-11-15 DIAGNOSIS — Z9889 Other specified postprocedural states: Secondary | ICD-10-CM | POA: Diagnosis not present

## 2021-11-15 DIAGNOSIS — M19011 Primary osteoarthritis, right shoulder: Secondary | ICD-10-CM | POA: Diagnosis not present

## 2021-11-15 NOTE — Progress Notes (Signed)
Cory Thomas 71 y.o.   Chief Complaint  Patient presents with   Shoulder Pain    Right side, pt would like to discuss throat issues and warts on feet    HISTORY OF PRESENT ILLNESS: This is a 71 y.o. male with 3 complaints today: 1.  Right shoulder pain for about a year but worse the last 2 to 3 months.  Dull pain inside the joint. 2.  Has history of esophageal stenosis which was dilated 2 years ago.  Symptoms going back. We will follow-up with his GI doctor, Dr. Havery Moros. 3.  Bilateral feet issues either calluses or plantar warts. Physically active man with a healthy lifestyle. A1A Wife is from [place], [place].  Shoulder Pain  Pertinent negatives include no fever.    Prior to Admission medications   Medication Sig Start Date End Date Taking? Authorizing Provider  Alpha-D-Galactosidase Satira Mccallum) TABS Take 2 tablets by mouth daily. 800 Galu   Yes [provider]  calcium carbonate (OS-CAL - DOSED IN MG OF ELEMENTAL CALCIUM) 1250 (500 Ca) MG tablet Take 1 tablet by mouth.   Yes [provider]  Cholecalciferol (VITAMIN D3) 50 MCG (2000 UT) TABS Take 6,000 Units by mouth daily.   Yes [provider]  zinc gluconate 50 MG tablet Take 25 mg by mouth daily.   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 06/24/2020     Past Medical History:  Diagnosis Date   Allergy    Anxiety    per pt after father died   Asthma    age 32   Cancer ([place])    skin on arms and legs years ago rmoved over 10 yrs ago   Gastritis    years ago   Osteoporosis    Refusal of blood product     Past Surgical  History:  Procedure Laterality Date   CLOSED REDUCTION WRIST FRACTURE     71 years old   ESOPHAGOGASTRODUODENOSCOPY N/A 03/05/2020   Procedure: ESOPHAGOGASTRODUODENOSCOPY (EGD);  Surgeon: Milus Banister, MD;  Location: [facility];  Service: Endoscopy;  Laterality: N/A;   EUS N/A 03/05/2020   Procedure: UPPER ENDOSCOPIC ULTRASOUND (EUS) LINEAR;  Surgeon: Milus Banister, MD;  Location: [facility];  Service: Endoscopy;  Laterality: N/A;   FINE NEEDLE ASPIRATION N/A 03/05/2020   Procedure: FINE NEEDLE ASPIRATION (FNA) LINEAR;  Surgeon: Milus Banister, MD;  Location: [facility];  Service: Endoscopy;  Laterality: N/A;   POLYPECTOMY  12/2018   colon   TONSILLECTOMY     WISDOM TOOTH EXTRACTION      Social History   Socioeconomic History   Marital status: Married    Spouse name: Not on file   Number of children: Not on file   Years of education: Not on file   Highest education level: Not on file  Occupational History   Occupation: Air duct cleaning  Tobacco Use   Smoking status: Never   Smokeless tobacco: Never  Vaping Use   Vaping Use: Never used  Substance and Sexual Activity   Alcohol use: Yes    Alcohol/week: 3.0 standard drinks  Types: 3 Glasses of wine per week    Comment: 1-2 bottles of beer/week, red wine   Drug use: Never   Sexual activity: Not on file  Other Topics Concern   Not on file  Social History Narrative   Jehovah's Witness   From [place] to hike   Social Determinants of Health   Financial Resource Strain: Low Risk    Difficulty of Paying Living Expenses: Not hard at all  Food Insecurity: No Food Insecurity   Worried About Charity fundraiser in the Last Year: Never true   Arboriculturist in the Last Year: Never true  Transportation Needs: No Transportation Needs   Lack of Transportation (Medical): No   Lack of Transportation (Non-Medical): No  Physical Activity: Sufficiently Active   Days of Exercise per Week: 7 days   Minutes of  Exercise per Session: 60 min  Stress: No Stress Concern Present   Feeling of Stress : Not at all  Social Connections: Socially Integrated   Frequency of Communication with Friends and Family: More than three times a week   Frequency of Social Gatherings with Friends and Family: More than three times a week   Attends Religious Services: More than 4 times per year   Active Member of [employer] or Organizations: Yes   Attends Music therapist: More than 4 times per year   Marital Status: Married  Human resources officer Violence: Not At Risk   Fear of Current or Ex-Partner: No   Emotionally Abused: No   Physically Abused: No   Sexually Abused: No    Family History  Problem Relation Age of Onset   Dementia Father    Colon cancer Neg Hx    Colon polyps Neg Hx    Esophageal cancer Neg Hx    Rectal cancer Neg Hx    Stomach cancer Neg Hx      Review of Systems  Constitutional: Negative.  Negative for chills and fever.  HENT:  Negative for congestion and sore throat.   Respiratory: Negative.  Negative for cough and shortness of breath.   Cardiovascular: Negative.  Negative for chest pain and palpitations.  Gastrointestinal:  Negative for abdominal pain, blood in stool, melena, nausea and vomiting.  Genitourinary: Negative.   Musculoskeletal:  Positive for joint pain (Right shoulder).  Skin: Negative.   Neurological:  Negative for dizziness and headaches.  All other systems reviewed and are negative.  Today's Vitals   11/15/21 1014  BP: 114/62  Pulse: 62  SpO2: 98%  Weight: 143 lb (64.9 kg)  Height: '5\' 10"'$  (1.778 m)   Body mass index is 20.52 kg/m.  Physical Exam Vitals reviewed.  Constitutional:      Appearance: Normal appearance.  HENT:     Head: Normocephalic.  Eyes:     Extraocular Movements: Extraocular movements intact.     Pupils: Pupils are equal, round, and reactive to light.  Cardiovascular:     Rate and Rhythm: Normal rate and regular rhythm.      Pulses: Normal pulses.     Heart sounds: Normal heart sounds.  Pulmonary:     Effort: Pulmonary effort is normal.     Breath sounds: Normal breath sounds.  Musculoskeletal:     Comments: Right shoulder: Limited range of motion.  No crepitation.  No swelling or erythema. Rest of right upper extremity: Neurovascularly intact.  No other abnormal findings. Feet: Bilateral corns/calluses lateral aspects.  Skin:    General: Skin  is warm and dry.     Capillary Refill: Capillary refill takes less than 2 seconds.  Neurological:     General: No focal deficit present.     Mental Status: He is alert and oriented to person, place, and time.  Psychiatric:        Mood and Affect: Mood normal.        Behavior: Behavior normal.   DG Shoulder Right  Result Date: 11/15/2021 CLINICAL DATA:  Chronic right shoulder pain EXAM: RIGHT SHOULDER - 2+ VIEW COMPARISON:  None. FINDINGS: There is no evidence of acute fracture or dislocation. Alignment is normal. There is mild to moderate acromioclavicular joint space narrowing with mild associated osteophytosis. The glenohumeral joint space appears preserved. There are calcifications adjacent to the greater tuberosity. The soft tissues are otherwise unremarkable. IMPRESSION: 1. Calcifications adjacent to the greater tuberosity suggestive of calcific tendinitis. 2. Mild-to-moderate degenerative changes of the [facility] joint. Electronically Signed   By: Valetta Mole M.D.   On: 11/15/2021 11:15     ASSESSMENT & PLAN: Problem List Items Addressed This Visit       Digestive   Stricture and stenosis of esophagus s/p EGD dilitation May 2021     Musculoskeletal and Integument   Foot callus    Affecting quality of life.  Will refer to podiatry for possible intervention.      Relevant Orders   Ambulatory referral to Podiatry     Other   Chronic right shoulder pain - Primary    Not improving and affecting quality of life.  Very limited range of motion. We will do x-rays  today.  Needs orthopedic evaluation.      Relevant Orders   DG Shoulder Right   Ambulatory referral to Orthopedic Surgery   History of esophageal dilatation    Symptoms starting to come back.  We will follow-up with GI doctor.  May need upper endoscopy and possible redilatation.      Patient Instructions  Corns and Calluses Corns are small areas of thickened skin that form on the top, sides, or tip of a toe. Corns have a cone-shaped core with a point that can press on a nerve below. This causes pain. Calluses are areas of thickened skin that can form anywhere on the body, including the hands, fingers, palms, soles of the feet, and heels. Calluses are usually larger than corns. What are the causes? Corns and calluses are caused by rubbing (friction) or pressure, such as from shoes that are too tight or do not fit properly. What increases the risk? Corns are more likely to develop in people who have misshapen toes (toe deformities), such as hammer toes. Calluses can form with friction to any area of the skin. They are more likely to develop in people who: Work with their hands. Wear shoes that fit poorly, are too tight, or are high-heeled. Have toe deformities. What are the signs or symptoms? Symptoms of a corn or callus include: A hard growth on the skin. Pain or tenderness under the skin. Redness and swelling. Increased discomfort while wearing tight-fitting shoes, if your feet are affected. If a corn or callus becomes infected, symptoms may include: Redness and swelling that gets worse. Pain. Fluid, blood, or pus draining from the corn or callus. How is this diagnosed? Corns and calluses may be diagnosed based on your symptoms, your medical history, and a physical exam. How is this treated? Treatment for corns and calluses may include: Removing the cause of the friction or  pressure. This may involve: Changing your shoes. Wearing shoe inserts (orthotics) or other protective  layers in your shoes, such as a corn pad. Wearing gloves. Applying medicine to the skin (topical medicine) to help soften skin in the hardened, thickened areas. Removing layers of dead skin with a file to reduce the size of the corn or callus. Removing the corn or callus with a scalpel or laser. Taking antibiotic medicines, if your corn or callus is infected. Having surgery, if a toe deformity is the cause. Follow these instructions at home:  Take over-the-counter and prescription medicines only as told by your health care provider. If you were prescribed an antibiotic medicine, take it as told by your health care provider. Do not stop taking it even if your condition improves. Wear shoes that fit well. Avoid wearing high-heeled shoes and shoes that are too tight or too loose. Wear any padding, protective layers, gloves, or orthotics as told by your health care provider. Soak your hands or feet. Then use a file or pumice stone to soften your corn or callus. Do this as told by your health care provider. Check your corn or callus every day for signs of infection. Contact a health care provider if: Your symptoms do not improve with treatment. You have redness or swelling that gets worse. Your corn or callus becomes painful. You have fluid, blood, or pus coming from your corn or callus. You have new symptoms. Get help right away if: You develop severe pain with redness. Summary Corns are small areas of thickened skin that form on the top, sides, or tip of a toe. These can be painful. Calluses are areas of thickened skin that can form anywhere on the body, including the hands, fingers, palms, and soles of the feet. Calluses are usually larger than corns. Corns and calluses are caused by rubbing (friction) or pressure, such as from shoes that are too tight or do not fit properly. Treatment may include wearing padding, protective layers, gloves, or orthotics as told by your health care  provider. This information is not intended to replace advice given to you by your health care provider. Make sure you discuss any questions you have with your health care provider. Document Revised: 12/26/2019 Document Reviewed: 12/26/2019 Elsevier Patient Education  2022 [place], MD [facility]

## 2021-11-15 NOTE — Patient Instructions (Signed)
Corns and Calluses Corns are small areas of thickened skin that form on the top, sides, or tip of a toe. Corns have a cone-shaped core with a point that can press on a nerve below. This causes pain. Calluses are areas of thickened skin that can form anywhere on the body, including the hands, fingers, palms, soles of the feet, and heels. Calluses are usually larger than corns. What are the causes? Corns and calluses are caused by rubbing (friction) or pressure, such as from shoes that are too tight or do not fit properly. What increases the risk? Corns are more likely to develop in people who have misshapen toes (toe deformities), such as hammer toes. Calluses can form with friction to any area of the skin. They are more likely to develop in people who: Work with their hands. Wear shoes that fit poorly, are too tight, or are high-heeled. Have toe deformities. What are the signs or symptoms? Symptoms of a corn or callus include: A hard growth on the skin. Pain or tenderness under the skin. Redness and swelling. Increased discomfort while wearing tight-fitting shoes, if your feet are affected. If a corn or callus becomes infected, symptoms may include: Redness and swelling that gets worse. Pain. Fluid, blood, or pus draining from the corn or callus. How is this diagnosed? Corns and calluses may be diagnosed based on your symptoms, your medical history, and a physical exam. How is this treated? Treatment for corns and calluses may include: Removing the cause of the friction or pressure. This may involve: Changing your shoes. Wearing shoe inserts (orthotics) or other protective layers in your shoes, such as a corn pad. Wearing gloves. Applying medicine to the skin (topical medicine) to help soften skin in the hardened, thickened areas. Removing layers of dead skin with a file to reduce the size of the corn or callus. Removing the corn or callus with a scalpel or laser. Taking antibiotic  medicines, if your corn or callus is infected. Having surgery, if a toe deformity is the cause. Follow these instructions at home:  Take over-the-counter and prescription medicines only as told by your health care provider. If you were prescribed an antibiotic medicine, take it as told by your health care provider. Do not stop taking it even if your condition improves. Wear shoes that fit well. Avoid wearing high-heeled shoes and shoes that are too tight or too loose. Wear any padding, protective layers, gloves, or orthotics as told by your health care provider. Soak your hands or feet. Then use a file or pumice stone to soften your corn or callus. Do this as told by your health care provider. Check your corn or callus every day for signs of infection. Contact a health care provider if: Your symptoms do not improve with treatment. You have redness or swelling that gets worse. Your corn or callus becomes painful. You have fluid, blood, or pus coming from your corn or callus. You have new symptoms. Get help right away if: You develop severe pain with redness. Summary Corns are small areas of thickened skin that form on the top, sides, or tip of a toe. These can be painful. Calluses are areas of thickened skin that can form anywhere on the body, including the hands, fingers, palms, and soles of the feet. Calluses are usually larger than corns. Corns and calluses are caused by rubbing (friction) or pressure, such as from shoes that are too tight or do not fit properly. Treatment may include wearing padding, protective   layers, gloves, or orthotics as told by your health care provider. This information is not intended to replace advice given to you by your health care provider. Make sure you discuss any questions you have with your health care provider. Document Revised: 12/26/2019 Document Reviewed: 12/26/2019 Elsevier Patient Education  2022 Elsevier Inc.  

## 2021-11-15 NOTE — Assessment & Plan Note (Signed)
Affecting quality of life.  Will refer to podiatry for possible intervention. ?

## 2021-11-15 NOTE — Assessment & Plan Note (Signed)
Symptoms starting to come back.  We will follow-up with GI doctor.  May need upper endoscopy and possible redilatation. ?

## 2021-11-15 NOTE — Assessment & Plan Note (Signed)
Not improving and affecting quality of life.  Very limited range of motion. ?We will do x-rays today.  Needs orthopedic evaluation. ?

## 2021-11-23 DIAGNOSIS — M25511 Pain in right shoulder: Secondary | ICD-10-CM | POA: Diagnosis not present

## 2021-11-25 ENCOUNTER — Other Ambulatory Visit: Payer: Self-pay

## 2021-11-25 ENCOUNTER — Ambulatory Visit: Payer: PPO | Admitting: Podiatry

## 2021-11-25 ENCOUNTER — Encounter: Payer: Self-pay | Admitting: Podiatry

## 2021-11-25 DIAGNOSIS — M21621 Bunionette of right foot: Secondary | ICD-10-CM | POA: Diagnosis not present

## 2021-11-25 DIAGNOSIS — L84 Corns and callosities: Secondary | ICD-10-CM

## 2021-11-25 DIAGNOSIS — M21622 Bunionette of left foot: Secondary | ICD-10-CM

## 2021-11-26 NOTE — Progress Notes (Signed)
Subjective:  ? ?Patient ID: Cory Thomas, male   DOB: 71 y.o.   MRN: [id number]  ? ?HPI ?Patient states that he is very active and has developed chronic calluses of his outside of his feet and is wondering if there is anything else that can be done he uses acids on them and inserts currently and wants to stay active.  Patient does a lot of hiking does not smoke ? ? ?Review of Systems  ?All other systems reviewed and are negative. ? ? ?   ?Objective:  ?Physical Exam ?Vitals and nursing note reviewed.  ?Constitutional:   ?   Appearance: He is well-developed.  ?Pulmonary:  ?   Effort: Pulmonary effort is normal.  ?Musculoskeletal:     ?   General: Normal range of motion.  ?Skin: ?   General: Skin is warm.  ?Neurological:  ?   Mental Status: He is alert.  ?  ?Neurovascular status found to be intact muscle strength found to be adequate range of motion within normal limits.  Patient is noted to have hypertrophy of the fifth metatarsal head bilateral with keratotic tissue which forms on the outside but he uses acid on it so it is tough to define the tissue.  Patient has good digital perfusion well oriented x2 ? ?   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 ?   ?Plan:  ?H&P x-rays conditions reviewed with patient.  I discussed with him the etiology of these and that we can try a more robust insert or at 1 point in future metatarsal head resection but since he is able to for the most part handle this himself I recommended he continue this treatment and if we need to do more it will be necessary.  I do want him to be careful with acids as a cast can cause pathology if he uses too much of this to the skin tissue ?   ? ? ?

## 2021-11-27 IMAGING — CT CT ABD-PELV W/ CM
2 of 6 series · 16 of 46 positions shown, 18 images · IV contrast (omnipaque)
Comparison: 05/11/2004 from [HOSPITAL]

CLINICAL DATA: Abdominal pain and nausea and vomiting for several
months. Gastric mass on recent endoscopy.

EXAM:
CT ABDOMEN AND PELVIS WITH CONTRAST
TECHNIQUE: Multidetector CT imaging of the abdomen and pelvis was performed
using the standard protocol following bolus administration of
intravenous contrast.
CONTRAST:  100mL OMNIPAQUE IOHEXOL 300 MG/ML  SOLN

[Series 5: stomach thins · axial · 0.71mm/px · z∈[-533,-145]mm · 13 of 604 slices shown, 15 images]
[im 25/604  soft-tissue]
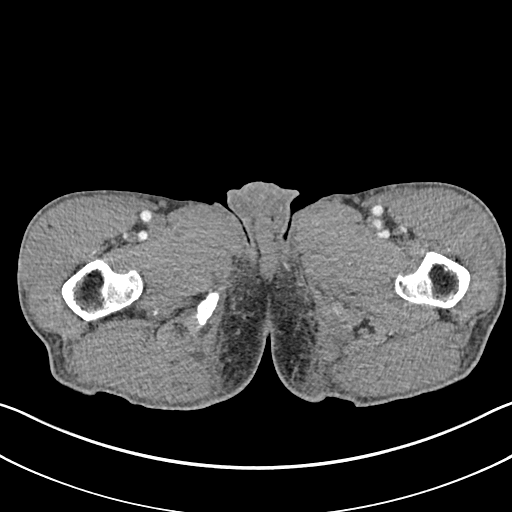
[im 25/604  bone]
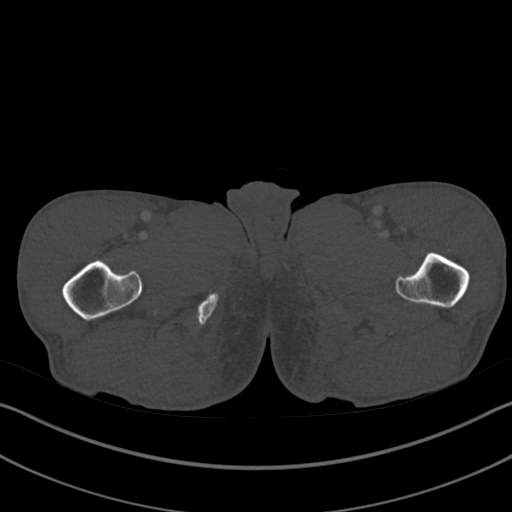
[im 73/604  soft-tissue]
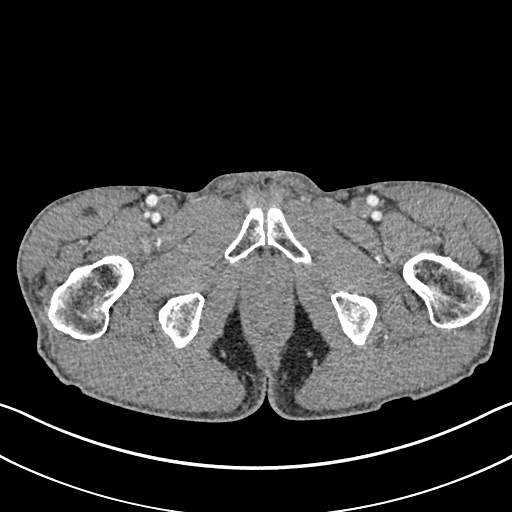
[im 121/604  soft-tissue]
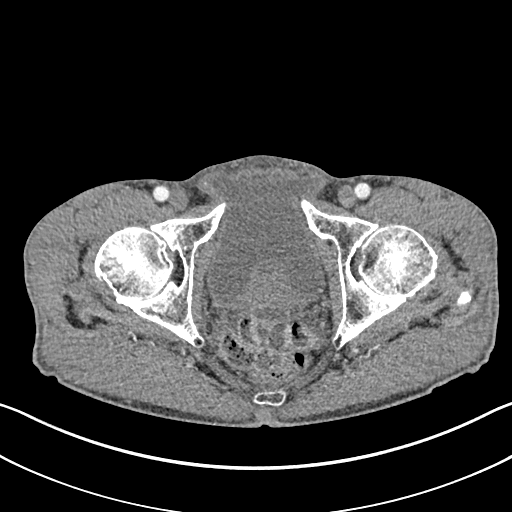
[im 169/604  soft-tissue]
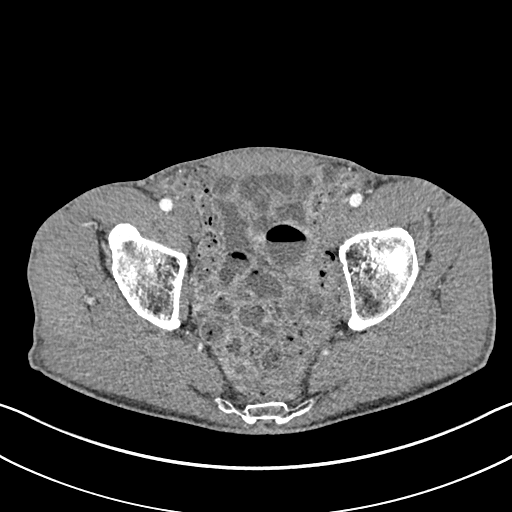
[im 218/604  soft-tissue]
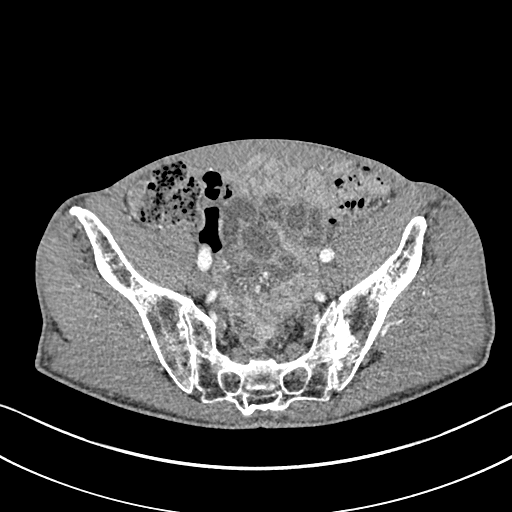
[im 266/604  soft-tissue]
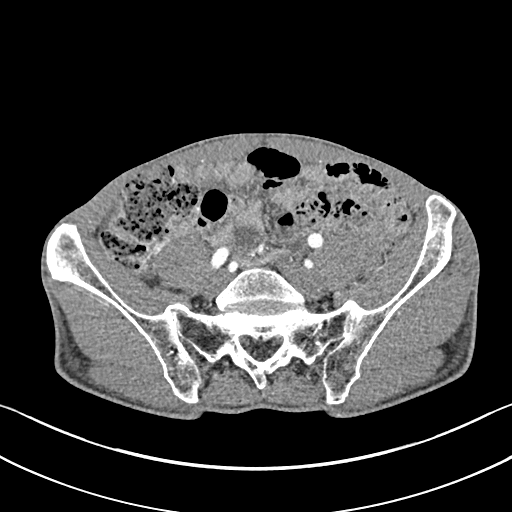
[im 314/604  soft-tissue]
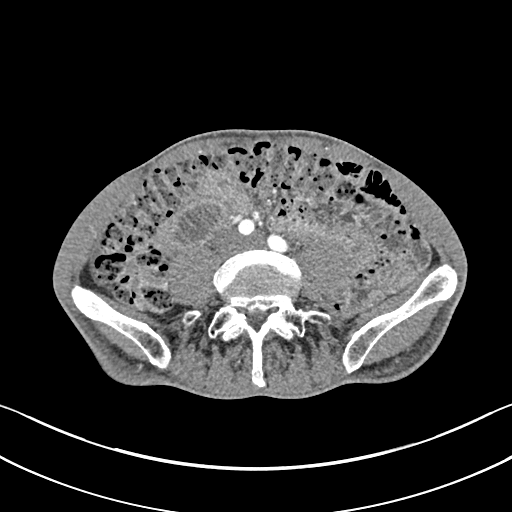
[im 338/604  soft-tissue]
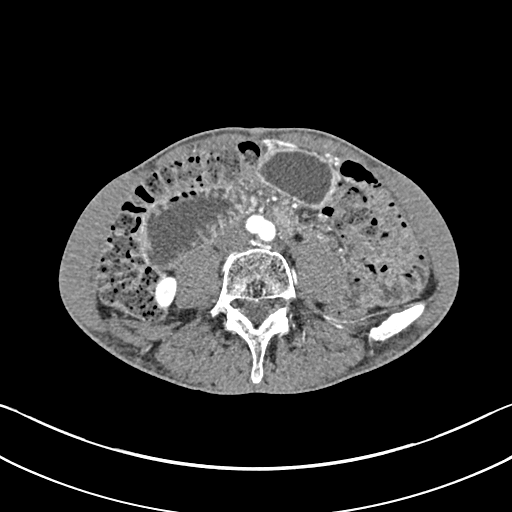
[im 386/604  soft-tissue]
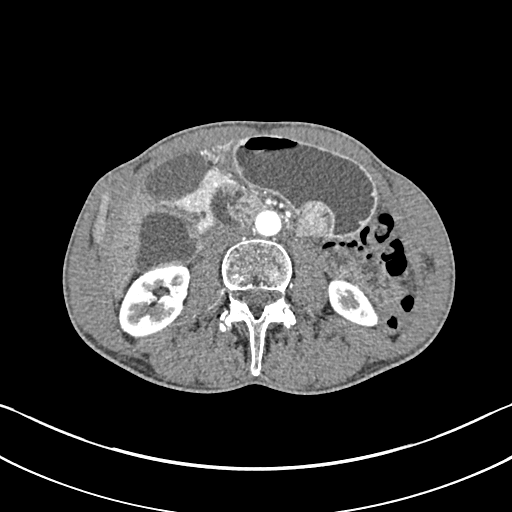
[im 386/604  bone]
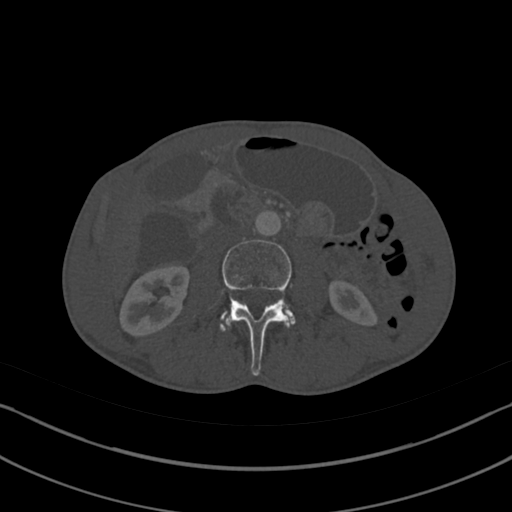
[im 435/604  soft-tissue]
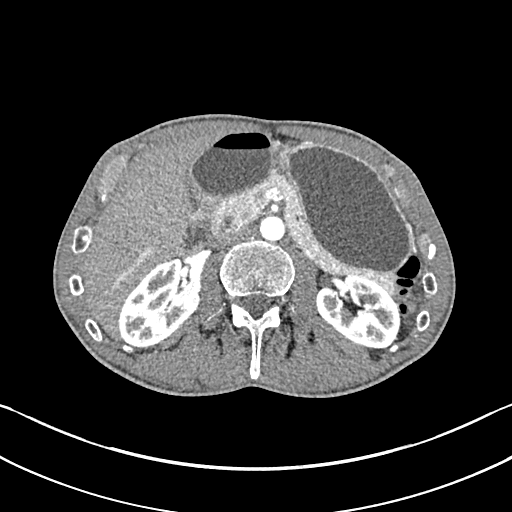
[im 483/604  soft-tissue]
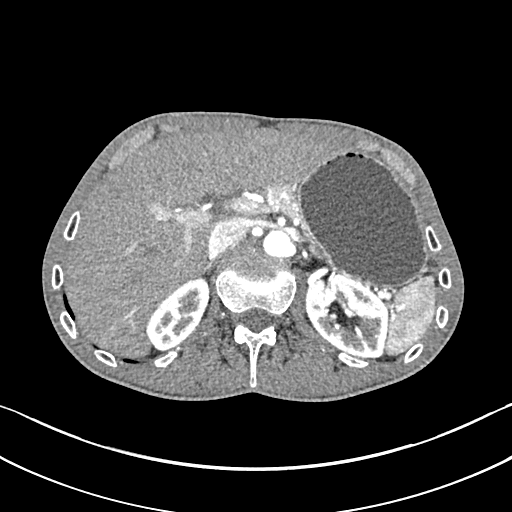
[im 531/604  soft-tissue]
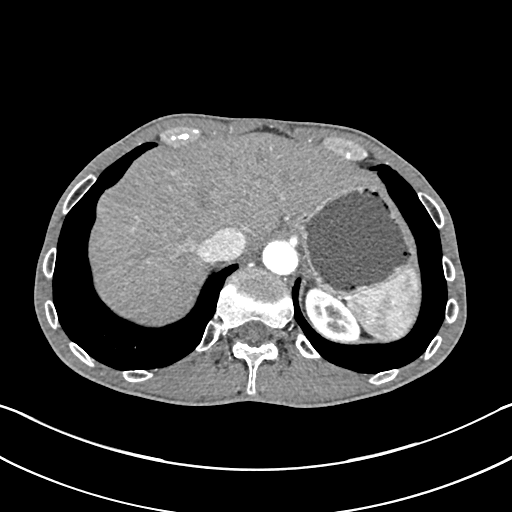
[im 579/604  soft-tissue]
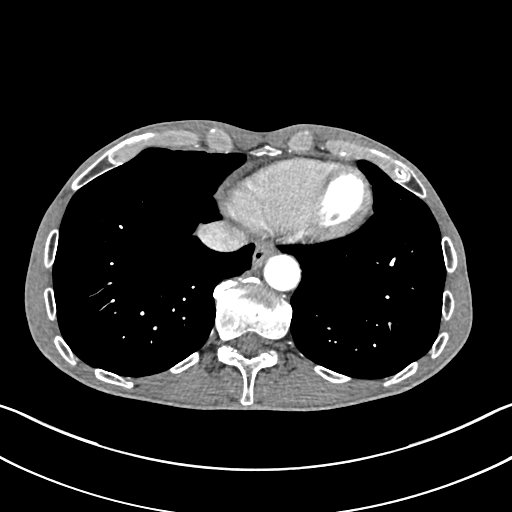

[Series 6: coronal soft tissue · coronal · 0.68mm/px · 3 of 68 slices shown]
[im 23/68  soft-tissue]
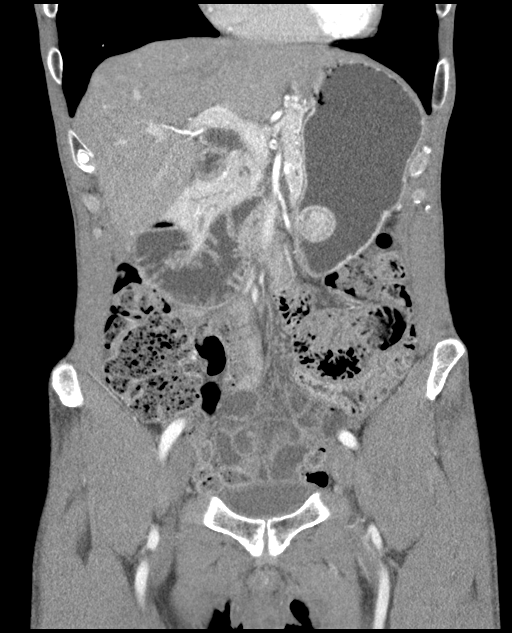
[im 30/68  soft-tissue]
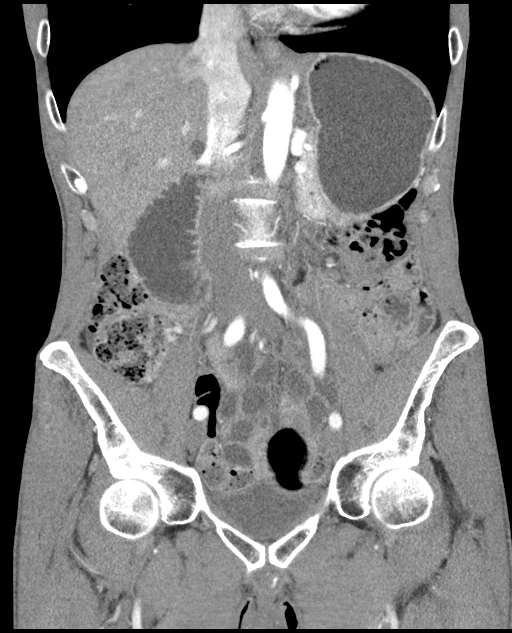
[im 38/68  soft-tissue]
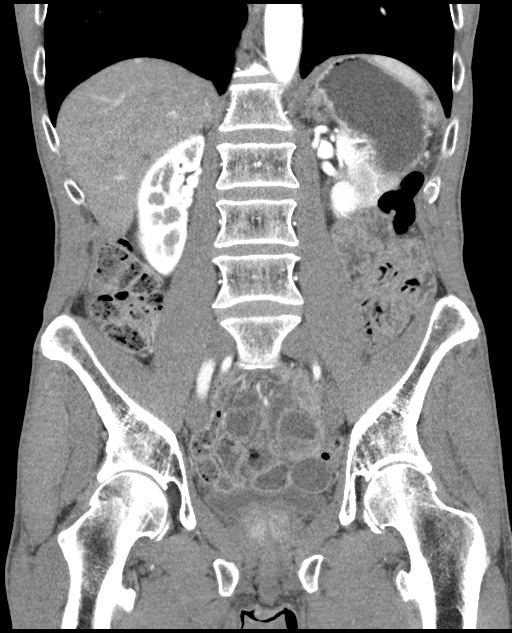

[16 of 46 positions shown; findings below may reference images not displayed]

FINDINGS: Lower Chest: No acute findings.

Hepatobiliary: A few tiny hepatic cysts are noted, largest in the
central right lobe adjacent to the IVC measuring 1.3 cm. A 5 mm
hypervascular lesion is seen in the posterior dome of the right lobe
on image [DATE]. this cannot be characterized due to its small size,
but likely represents a tiny benign flash-filling hemangioma or
focal nodular hyperplasia. No other liver masses identified.
Gallbladder is unremarkable. No evidence of biliary ductal
dilatation.

Pancreas:  No mass or inflammatory changes.

Spleen: Within normal limits in size and appearance.

Adrenals/Urinary Tract: No masses identified. A few tiny sub-cm left
renal cysts are noted. No evidence of ureteral calculi or
hydronephrosis.

Stomach/Bowel: Stomach and proximal duodenum are distended by
ingested water. A hypervascular submucosal mass is seen involving
the posterior wall of the gastric body which measures 3.1 x 0.0 by
2.8 cm. This is consistent with a gastric neoplasm. Most likely
differential diagnosis is GI stromal tumor, with carcinoma and
lymphoma considered less likely. No evidence of obstruction,
inflammatory process or abnormal fluid collections.

Vascular/Lymphatic: No pathologically enlarged lymph nodes. No
abdominal aortic aneurysm.

Reproductive:  No mass or other significant abnormality.

Other:  None.

Musculoskeletal:  No suspicious bone lesions identified.
IMPRESSION: 1. 3 cm hypervascular submucosal mass involving the posterior wall
of the gastric body, consistent with neoplasm. Most likely
differential diagnosis is GI stromal tumor, with carcinoma and
lymphoma considered less likely.
2. No evidence of metastatic disease within the abdomen or pelvis.
3. 5 mm hypervascular lesion in dome of right hepatic lobe is too
small to characterize, but likely represents a tiny benign
flash-filling hemangioma or focal nodular hyperplasia. Recommend
continued attention on follow-up CT.

## 2022-01-25 ENCOUNTER — Encounter: Payer: PPO | Admitting: Emergency Medicine

## 2022-02-09 ENCOUNTER — Ambulatory Visit (INDEPENDENT_AMBULATORY_CARE_PROVIDER_SITE_OTHER): Payer: PPO | Admitting: Emergency Medicine

## 2022-02-09 ENCOUNTER — Encounter: Payer: Self-pay | Admitting: Emergency Medicine

## 2022-02-09 VITALS — BP 112/68 | HR 81 | Temp 97.8°F | Ht 70.0 in | Wt 140.2 lb

## 2022-02-09 DIAGNOSIS — Z13228 Encounter for screening for other metabolic disorders: Secondary | ICD-10-CM | POA: Diagnosis not present

## 2022-02-09 DIAGNOSIS — Z0000 Encounter for general adult medical examination without abnormal findings: Secondary | ICD-10-CM

## 2022-02-09 DIAGNOSIS — Z1329 Encounter for screening for other suspected endocrine disorder: Secondary | ICD-10-CM | POA: Diagnosis not present

## 2022-02-09 DIAGNOSIS — Z125 Encounter for screening for malignant neoplasm of prostate: Secondary | ICD-10-CM

## 2022-02-09 DIAGNOSIS — Z130 Encounter for screening for diseases of the blood and blood-forming organs and certain disorders involving the immune mechanism: Secondary | ICD-10-CM

## 2022-02-09 DIAGNOSIS — Z13220 Encounter for screening for lipoid disorders: Secondary | ICD-10-CM | POA: Diagnosis not present

## 2022-02-09 LAB — CBC WITH DIFFERENTIAL/PLATELET
Basophils Absolute: 0.1 10*3/uL (ref 0.0–0.1)
Basophils Relative: 1 % (ref 0.0–3.0)
Eosinophils Absolute: 0.2 10*3/uL (ref 0.0–0.7)
Eosinophils Relative: 3.5 % (ref 0.0–5.0)
HCT: 44.4 % (ref 39.0–52.0)
Hemoglobin: 14.8 g/dL (ref 13.0–17.0)
Lymphocytes Relative: 12.7 % (ref 12.0–46.0)
Lymphs Abs: 0.8 10*3/uL (ref 0.7–4.0)
MCHC: 33.2 g/dL (ref 30.0–36.0)
MCV: 92.1 fl (ref 78.0–100.0)
Monocytes Absolute: 0.7 10*3/uL (ref 0.1–1.0)
Monocytes Relative: 12.1 % — ABNORMAL HIGH (ref 3.0–12.0)
Neutro Abs: 4.4 10*3/uL (ref 1.4–7.7)
Neutrophils Relative %: 70.7 % (ref 43.0–77.0)
Platelets: 153 10*3/uL (ref 150.0–400.0)
RBC: 4.82 Mil/uL (ref 4.22–5.81)
RDW: 13.9 % (ref 11.5–15.5)
WBC: 6.2 10*3/uL (ref 4.0–10.5)

## 2022-02-09 LAB — COMPREHENSIVE METABOLIC PANEL
ALT: 26 U/L (ref 0–53)
AST: 28 U/L (ref 0–37)
Albumin: 4.2 g/dL (ref 3.5–5.2)
Alkaline Phosphatase: 77 U/L (ref 39–117)
BUN: 24 mg/dL — ABNORMAL HIGH (ref 6–23)
CO2: 26 mEq/L (ref 19–32)
Calcium: 9.4 mg/dL (ref 8.4–10.5)
Chloride: 104 mEq/L (ref 96–112)
Creatinine, Ser: 0.98 mg/dL (ref 0.40–1.50)
GFR: 77.89 mL/min (ref >60.00)
Glucose, Bld: 107 mg/dL — ABNORMAL HIGH (ref 70–99)
Potassium: 4.4 mEq/L (ref 3.5–5.1)
Sodium: 138 mEq/L (ref 135–145)
Total Bilirubin: 0.9 mg/dL (ref 0.2–1.2)
Total Protein: 6.2 g/dL (ref 6.0–8.3)

## 2022-02-09 LAB — LIPID PANEL
Cholesterol: 168 mg/dL (ref 0–200)
HDL: 56.3 mg/dL (ref >39.00)
LDL Cholesterol: 94 mg/dL (ref 0–99)
NonHDL: 111.41
Total CHOL/HDL Ratio: 3
Triglycerides: 85 mg/dL (ref 0.0–149.0)
VLDL: 17 mg/dL (ref 0.0–40.0)

## 2022-02-09 LAB — PSA: PSA: 2.43 ng/mL (ref 0.10–4.00)

## 2022-02-09 NOTE — Patient Instructions (Signed)
Health Maintenance, Male Adopting a healthy lifestyle and getting preventive care are important in promoting health and wellness. Ask your health care provider about: The right schedule for you to have regular tests and exams. Things you can do on your own to prevent diseases and keep yourself healthy. What should I know about diet, weight, and exercise? Eat a healthy diet  Eat a diet that includes plenty of vegetables, fruits, low-fat dairy products, and lean protein. Do not eat a lot of foods that are high in solid fats, added sugars, or sodium. Maintain a healthy weight Body mass index (BMI) is a measurement that can be used to identify possible weight problems. It estimates body fat based on height and weight. Your health care provider can help determine your BMI and help you achieve or maintain a healthy weight. Get regular exercise Get regular exercise. This is one of the most important things you can do for your health. Most adults should: Exercise for at least 150 minutes each week. The exercise should increase your heart rate and make you sweat (moderate-intensity exercise). Do strengthening exercises at least twice a week. This is in addition to the moderate-intensity exercise. Spend less time sitting. Even light physical activity can be beneficial. Watch cholesterol and blood lipids Have your blood tested for lipids and cholesterol at 71 years of age, then have this test every 5 years. You may need to have your cholesterol levels checked more often if: Your lipid or cholesterol levels are high. You are older than 71 years of age. You are at high risk for heart disease. What should I know about cancer screening? Many types of cancers can be detected early and may often be prevented. Depending on your health history and family history, you may need to have cancer screening at various ages. This may include screening for: Colorectal cancer. Prostate cancer. Skin cancer. Lung  cancer. What should I know about heart disease, diabetes, and high blood pressure? Blood pressure and heart disease High blood pressure causes heart disease and increases the risk of stroke. This is more likely to develop in people who have high blood pressure readings or are overweight. Talk with your health care provider about your target blood pressure readings. Have your blood pressure checked: Every 3-5 years if you are 18-39 years of age. Every year if you are 40 years old or older. If you are between the ages of 65 and 75 and are a current or former smoker, ask your health care provider if you should have a one-time screening for abdominal aortic aneurysm (AAA). Diabetes Have regular diabetes screenings. This checks your fasting blood sugar level. Have the screening done: Once every three years after age 45 if you are at a normal weight and have a low risk for diabetes. More often and at a younger age if you are overweight or have a high risk for diabetes. What should I know about preventing infection? Hepatitis B If you have a higher risk for hepatitis B, you should be screened for this virus. Talk with your health care provider to find out if you are at risk for hepatitis B infection. Hepatitis C Blood testing is recommended for: Everyone born from 1945 through 1965. Anyone with known risk factors for hepatitis C. Sexually transmitted infections (STIs) You should be screened each year for STIs, including gonorrhea and chlamydia, if: You are sexually active and are younger than 71 years of age. You are older than 71 years of age and your   health care provider tells you that you are at risk for this type of infection. Your sexual activity has changed since you were last screened, and you are at increased risk for chlamydia or gonorrhea. Ask your health care provider if you are at risk. Ask your health care provider about whether you are at high risk for HIV. Your health care provider  may recommend a prescription medicine to help prevent HIV infection. If you choose to take medicine to prevent HIV, you should first get tested for HIV. You should then be tested every 3 months for as long as you are taking the medicine. Follow these instructions at home: Alcohol use Do not drink alcohol if your health care provider tells you not to drink. If you drink alcohol: Limit how much you have to 0-2 drinks a day. Know how much alcohol is in your drink. In the [place] one drink equals one 12 oz bottle of beer (355 mL), one 5 oz glass of wine (148 mL), or one 1 oz glass of hard liquor (44 mL). Lifestyle Do not use any products that contain nicotine or tobacco. These products include cigarettes, chewing tobacco, and vaping devices, such as e-cigarettes. If you need help quitting, ask your health care provider. Do not use street drugs. Do not share needles. Ask your health care provider for help if you need support or information about quitting drugs. General instructions Schedule regular health, dental, and eye exams. Stay current with your vaccines. Tell your health care provider if: You often feel depressed. You have ever been abused or do not feel safe at home. Summary Adopting a healthy lifestyle and getting preventive care are important in promoting health and wellness. Follow your health care provider's instructions about healthy diet, exercising, and getting tested or screened for diseases. Follow your health care provider's instructions on monitoring your cholesterol and blood pressure. This information is not intended to replace advice given to you by your health care provider. Make sure you discuss any questions you have with your health care provider. Document Revised: 01/18/2021 Document Reviewed: 01/18/2021 Elsevier Patient Education  2023 Elsevier Inc.  

## 2022-02-09 NOTE — Progress Notes (Signed)
Cory Thomas 71 y.o.   Chief Complaint  Patient presents with   Annual Exam    HISTORY OF PRESENT ILLNESS: This is a 71 y.o. male here for annual exam. Overall doing well. Right shoulder chronic pain.  Sees orthopedist on a regular basis.  Diagnosis of bursitis.  About 80% better. History of foot calluses.  Sees podiatrist on a regular basis History of esophageal stenosis status postdilatation 01/29/2020 Occasional pain in the right nipple Bilateral cataracts.  Already evaluated by ophthalmologist. No other complaints or medical concerns today.  HPI   Prior to Admission medications   Medication Sig Start Date End Date Taking? Authorizing Provider  Alpha-D-Galactosidase Satira Mccallum) TABS Take 2 tablets by mouth daily. 800 Galu   Yes [provider]  calcium carbonate (OS-CAL - DOSED IN MG OF ELEMENTAL CALCIUM) 1250 (500 Ca) MG tablet Take 1 tablet by mouth.   Yes [provider]  Cholecalciferol (VITAMIN D3) 50 MCG (2000 UT) TABS Take 6,000 Units by mouth daily.   Yes [provider]  zinc gluconate 50 MG tablet Take 25 mg by mouth daily.   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     Past Medical History:  Diagnosis Date   Allergy    Anxiety    per pt after father died   Asthma    age 13   Cancer ([place])    skin on arms and legs years ago rmoved over 10 yrs ago   Gastritis    years ago   Osteoporosis    Refusal of blood product     Past Surgical History:   Procedure Laterality Date   CLOSED REDUCTION WRIST FRACTURE     71 years old   ESOPHAGOGASTRODUODENOSCOPY N/A 03/05/2020   Procedure: ESOPHAGOGASTRODUODENOSCOPY (EGD);  Surgeon: Milus Banister, MD;  Location: [facility];  Service: Endoscopy;  Laterality: N/A;   EUS N/A 03/05/2020   Procedure: UPPER ENDOSCOPIC ULTRASOUND (EUS) LINEAR;  Surgeon: Milus Banister, MD;  Location: [facility];  Service: Endoscopy;  Laterality: N/A;   FINE NEEDLE ASPIRATION N/A 03/05/2020   Procedure: FINE NEEDLE ASPIRATION (FNA) LINEAR;  Surgeon: Milus Banister, MD;  Location: [facility];  Service: Endoscopy;  Laterality: N/A;   POLYPECTOMY  12/2018   colon   TONSILLECTOMY     WISDOM TOOTH EXTRACTION      Social History   Socioeconomic History   Marital status: Married    Spouse name: Not on file   Number of children: Not on file   Years of education: Not on file   Highest education level: Not on file  Occupational History   Occupation: Air duct cleaning  Tobacco Use   Smoking status: Never   Smokeless tobacco: Never  Vaping Use   Vaping Use: Never used  Substance and Sexual Activity   Alcohol use: Yes    Alcohol/week: 3.0 standard drinks    Types: 3 Glasses of wine per week    Comment: 1-2 bottles of beer/week, red  wine   Drug use: Never   Sexual activity: Not on file  Other Topics Concern   Not on file  Social History Narrative   Jehovah's Witness   From [place] to hike   Social Determinants of Health   Financial Resource Strain: Low Risk    Difficulty of Paying Living Expenses: Not hard at all  Food Insecurity: No Food Insecurity   Worried About Charity fundraiser in the Last Year: Never true   Arboriculturist in the Last Year: Never true  Transportation Needs: No Transportation Needs   Lack of Transportation (Medical): No   Lack of Transportation (Non-Medical): No  Physical Activity: Sufficiently Active   Days of Exercise per Week: 7 days   Minutes of Exercise  per Session: 60 min  Stress: No Stress Concern Present   Feeling of Stress : Not at all  Social Connections: Socially Integrated   Frequency of Communication with Friends and Family: More than three times a week   Frequency of Social Gatherings with Friends and Family: More than three times a week   Attends Religious Services: More than 4 times per year   Active Member of [employer] or Organizations: Yes   Attends Music therapist: More than 4 times per year   Marital Status: Married  Human resources officer Violence: Not At Risk   Fear of Current or Ex-Partner: No   Emotionally Abused: No   Physically Abused: No   Sexually Abused: No    Family History  Problem Relation Age of Onset   Dementia Father    Colon cancer Neg Hx    Colon polyps Neg Hx    Esophageal cancer Neg Hx    Rectal cancer Neg Hx    Stomach cancer Neg Hx      Review of Systems  Constitutional: Negative.  Negative for chills and fever.  HENT: Negative.  Negative for congestion and sore throat.   Respiratory: Negative.  Negative for cough and shortness of breath.   Cardiovascular: Negative.  Negative for chest pain and palpitations.  Gastrointestinal:  Negative for abdominal pain, diarrhea, nausea and vomiting.  Genitourinary: Negative.   Musculoskeletal:  Positive for joint pain (Right shoulder).  Skin: Negative.  Negative for rash.  Neurological:  Negative for dizziness and headaches.  All other systems reviewed and are negative. Today's Vitals   02/09/22 0758  BP: 112/68  Pulse: 81  Temp: 97.8 F (36.6 C)  TempSrc: Oral  SpO2: 96%  Weight: 140 lb 4 oz (63.6 kg)  Height: '5\' 10"'$  (1.778 m)   Body mass index is 20.12 kg/m.   Physical Exam Vitals reviewed.  Constitutional:      Appearance: Normal appearance.  HENT:     Head: Normocephalic.     Right Ear: Tympanic membrane, ear canal and external ear normal.     Left Ear: Tympanic membrane, ear canal and external ear normal.     Mouth/Throat:      Mouth: Mucous membranes are moist.     Pharynx: Oropharynx is clear.  Eyes:     Extraocular Movements: Extraocular movements intact.     Conjunctiva/sclera: Conjunctivae normal.     Pupils: Pupils are equal, round, and reactive to light.  Cardiovascular:     Rate and Rhythm: Normal rate and regular rhythm.     Pulses: Normal pulses.     Heart sounds: Normal heart sounds.  Pulmonary:     Effort: Pulmonary effort is normal.  Breath sounds: Normal breath sounds.  Abdominal:     General: Bowel sounds are normal. There is no distension.     Palpations: Abdomen is soft.     Tenderness: There is no abdominal tenderness.  Musculoskeletal:     Cervical back: No tenderness.     Right lower leg: No edema.     Left lower leg: No edema.  Lymphadenopathy:     Cervical: No cervical adenopathy.  Skin:    General: Skin is warm and dry.     Capillary Refill: Capillary refill takes less than 2 seconds.  Neurological:     General: No focal deficit present.     Mental Status: He is alert and oriented to person, place, and time.  Psychiatric:        Mood and Affect: Mood normal.        Behavior: Behavior normal.     ASSESSMENT & PLAN: Problem List Items Addressed This Visit   None Visit Diagnoses     Routine general medical examination at a health care facility    -  Primary   Prostate cancer screening       Relevant Orders   PSA(Must document that pt has been informed of limitations of PSA testing.)   Screening for deficiency anemia       Relevant Orders   CBC with Differential   Screening for lipoid disorders       Relevant Orders   Lipid panel   Screening for endocrine, metabolic and immunity disorder       Relevant Orders   Comprehensive metabolic panel   Hemoglobin A1c      Modifiable risk factors discussed with patient. Anticipatory guidance according to age provided. The following topics were also discussed: Social Determinants of Health Smoking.   Non-smoker Diet and nutrition.  Good nutritional habits Benefits of exercise.  Exercises regularly Cancer screening.  Up-to-date with colonoscopy Vaccinations recommendations.  Up-to-date Cardiovascular risk assessment The 10-year ASCVD risk score (Arnett DK, et al., 2019) is: 11.6%   Values used to calculate the score:     Age: 48 years     Sex: Male     Is Non-Hispanic African American: No     Diabetic: No     Tobacco smoker: No     Systolic Blood Pressure: 053 mmHg     Is BP treated: No     HDL Cholesterol: 57.7 mg/dL     Total Cholesterol: 143 mg/dL  Mental health including depression and anxiety Fall and accident prevention  Patient Instructions  Health Maintenance, Male Adopting a healthy lifestyle and getting preventive care are important in promoting health and wellness. Ask your health care provider about: The right schedule for you to have regular tests and exams. Things you can do on your own to prevent diseases and keep yourself healthy. What should I know about diet, weight, and exercise? Eat a healthy diet  Eat a diet that includes plenty of vegetables, fruits, low-fat dairy products, and lean protein. Do not eat a lot of foods that are high in solid fats, added sugars, or sodium. Maintain a healthy weight Body mass index (BMI) is a measurement that can be used to identify possible weight problems. It estimates body fat based on height and weight. Your health care provider can help determine your BMI and help you achieve or maintain a healthy weight. Get regular exercise Get regular exercise. This is one of the most important things you can do for your health.  Most adults should: Exercise for at least 150 minutes each week. The exercise should increase your heart rate and make you sweat (moderate-intensity exercise). Do strengthening exercises at least twice a week. This is in addition to the moderate-intensity exercise. Spend less time sitting. Even light physical  activity can be beneficial. Watch cholesterol and blood lipids Have your blood tested for lipids and cholesterol at 71 years of age, then have this test every 5 years. You may need to have your cholesterol levels checked more often if: Your lipid or cholesterol levels are high. You are older than 71 years of age. You are at high risk for heart disease. What should I know about cancer screening? Many types of cancers can be detected early and may often be prevented. Depending on your health history and family history, you may need to have cancer screening at various ages. This may include screening for: Colorectal cancer. Prostate cancer. Skin cancer. Lung cancer. What should I know about heart disease, diabetes, and high blood pressure? Blood pressure and heart disease High blood pressure causes heart disease and increases the risk of stroke. This is more likely to develop in people who have high blood pressure readings or are overweight. Talk with your health care provider about your target blood pressure readings. Have your blood pressure checked: Every 3-5 years if you are 62-20 years of age. Every year if you are 81 years old or older. If you are between the ages of 55 and 33 and are a current or former smoker, ask your health care provider if you should have a one-time screening for abdominal aortic aneurysm (AAA). Diabetes Have regular diabetes screenings. This checks your fasting blood sugar level. Have the screening done: Once every three years after age 42 if you are at a normal weight and have a low risk for diabetes. More often and at a younger age if you are overweight or have a high risk for diabetes. What should I know about preventing infection? Hepatitis B If you have a higher risk for hepatitis B, you should be screened for this virus. Talk with your health care provider to find out if you are at risk for hepatitis B infection. Hepatitis C Blood testing is recommended  for: Everyone born from [birth year] through 1965. Anyone with known risk factors for hepatitis C. Sexually transmitted infections (STIs) You should be screened each year for STIs, including gonorrhea and chlamydia, if: You are sexually active and are younger than 71 years of age. You are older than 71 years of age and your health care provider tells you that you are at risk for this type of infection. Your sexual activity has changed since you were last screened, and you are at increased risk for chlamydia or gonorrhea. Ask your health care provider if you are at risk. Ask your health care provider about whether you are at high risk for HIV. Your health care provider may recommend a prescription medicine to help prevent HIV infection. If you choose to take medicine to prevent HIV, you should first get tested for HIV. You should then be tested every 3 months for as long as you are taking the medicine. Follow these instructions at home: Alcohol use Do not drink alcohol if your health care provider tells you not to drink. If you drink alcohol: Limit how much you have to 0-2 drinks a day. Know how much alcohol is in your drink. In the [place] one drink equals one 12 oz bottle of beer (  355 mL), one 5 oz glass of wine (148 mL), or one 1 oz glass of hard liquor (44 mL). Lifestyle Do not use any products that contain nicotine or tobacco. These products include cigarettes, chewing tobacco, and vaping devices, such as e-cigarettes. If you need help quitting, ask your health care provider. Do not use street drugs. Do not share needles. Ask your health care provider for help if you need support or information about quitting drugs. General instructions Schedule regular health, dental, and eye exams. Stay current with your vaccines. Tell your health care provider if: You often feel depressed. You have ever been abused or do not feel safe at home. Summary Adopting a healthy lifestyle and getting preventive care  are important in promoting health and wellness. Follow your health care provider's instructions about healthy diet, exercising, and getting tested or screened for diseases. Follow your health care provider's instructions on monitoring your cholesterol and blood pressure. This information is not intended to replace advice given to you by your health care provider. Make sure you discuss any questions you have with your health care provider. Document Revised: 01/18/2021 Document Reviewed: 01/18/2021 Elsevier Patient Education  [place], MD [facility]

## 2022-02-10 ENCOUNTER — Encounter: Payer: Self-pay | Admitting: Emergency Medicine

## 2022-02-10 LAB — HEMOGLOBIN A1C
Est. average glucose Bld gHb Est-mCnc: 123 mg/dL
Hgb A1c MFr Bld: 5.9 % — ABNORMAL HIGH (ref 4.8–5.6)

## 2022-02-10 NOTE — Telephone Encounter (Signed)
No need to repeat this test.  Prediabetes means you have to pay attention to your diet and nutrition.  No medications needed. No concerns on my part.

## 2022-02-16 DIAGNOSIS — H259 Unspecified age-related cataract: Secondary | ICD-10-CM | POA: Diagnosis not present

## 2022-02-16 DIAGNOSIS — G8929 Other chronic pain: Secondary | ICD-10-CM | POA: Diagnosis not present

## 2022-02-16 DIAGNOSIS — M810 Age-related osteoporosis without current pathological fracture: Secondary | ICD-10-CM | POA: Diagnosis not present

## 2022-02-16 DIAGNOSIS — K222 Esophageal obstruction: Secondary | ICD-10-CM | POA: Diagnosis not present

## 2022-03-28 ENCOUNTER — Encounter: Payer: Self-pay | Admitting: Emergency Medicine

## 2022-03-28 ENCOUNTER — Ambulatory Visit (INDEPENDENT_AMBULATORY_CARE_PROVIDER_SITE_OTHER): Payer: PPO | Admitting: Emergency Medicine

## 2022-03-28 ENCOUNTER — Other Ambulatory Visit: Payer: Self-pay | Admitting: Emergency Medicine

## 2022-03-28 VITALS — BP 132/72 | HR 63 | Temp 97.6°F | Ht 70.0 in | Wt 139.0 lb

## 2022-03-28 DIAGNOSIS — N6341 Unspecified lump in right breast, subareolar: Secondary | ICD-10-CM

## 2022-03-28 NOTE — Progress Notes (Signed)
Cory Thomas 71 y.o.   Chief Complaint  Patient presents with  . nipple pain     Pain rt nipple area, lump underneath nipple     HISTORY OF PRESENT ILLNESS: This is a 71 y.o. male complaining of lump to right nipple area for the past several weeks. Starting to get tender. No other complaints or medical concerns today.  HPI   Prior to Admission medications   Medication Sig Start Date End Date Taking? Authorizing Provider  Alpha-D-Galactosidase Satira Mccallum) TABS Take 2 tablets by mouth daily. 800 Galu   Yes [provider]  Cholecalciferol (VITAMIN D3) 50 MCG (2000 UT) TABS Take 6,000 Units by mouth daily.   Yes [provider]  zinc gluconate 50 MG tablet Take 25 mg by mouth daily.   Yes [provider]  calcium carbonate (OS-CAL - DOSED IN MG OF ELEMENTAL CALCIUM) 1250 (500 Ca) MG tablet Take 1 tablet by mouth. Patient not taking: Reported on 03/28/2022    [provider]    Allergies  Allergen Reactions  . Other     Refuses Blood    Patient Active Problem List   Diagnosis Date Noted  . Chronic right shoulder pain 11/15/2021  . Foot callus 11/15/2021  . History of esophageal dilatation 11/15/2021  . Refusal of blood transfusions as patient is Jehovah's Witness 04/15/2020  . Dysphagia 04/15/2020  . Stricture and stenosis of esophagus s/p EGD dilitation May 2021 04/15/2020  . 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 Allergy   . Anxiety    per pt after father died  . Asthma    age 79  . Cancer ([place])    skin on arms and legs years ago rmoved over 10 yrs ago  . Gastritis    years ago  . Osteoporosis   . Refusal of blood product     Past Surgical History:  Procedure Laterality Date  . CLOSED REDUCTION WRIST FRACTURE     71 years old  . ESOPHAGOGASTRODUODENOSCOPY N/A 03/05/2020   Procedure: ESOPHAGOGASTRODUODENOSCOPY (EGD);   Surgeon: Milus Banister, MD;  Location: [facility];  Service: Endoscopy;  Laterality: N/A;  . EUS N/A 03/05/2020   Procedure: UPPER ENDOSCOPIC ULTRASOUND (EUS) LINEAR;  Surgeon: Milus Banister, MD;  Location: [facility];  Service: Endoscopy;  Laterality: N/A;  . FINE NEEDLE ASPIRATION N/A 03/05/2020   Procedure: FINE NEEDLE ASPIRATION (FNA) LINEAR;  Surgeon: Milus Banister, MD;  Location: [facility];  Service: Endoscopy;  Laterality: N/A;  . POLYPECTOMY  12/2018   colon  . TONSILLECTOMY    . WISDOM TOOTH EXTRACTION      Social History   Socioeconomic History  . Marital status: Married    Spouse name: Not on file  . Number of children: Not on file  . Years of education: Not on file  . Highest education level: Not on file  Occupational History  . Occupation: Probation officer  Tobacco Use  . Smoking status: Never  . Smokeless tobacco: Never  Vaping Use  . Vaping Use: Never used  Substance and Sexual Activity  . Alcohol use: Yes    Alcohol/week: 3.0 standard drinks of alcohol    Types: 3 Glasses of wine per week    Comment: 1-2 bottles of beer/week, red wine  . Drug use: Never  . Sexual activity: Not on file  Other Topics Concern  . Not on  file  Social History Narrative   Jehovah's Witness   From [place] to hike   Social Determinants of Health   Financial Resource Strain: Low Risk  (07/14/2021)   Overall Financial Resource Strain (CARDIA)   . Difficulty of Paying Living Expenses: Not hard at all  Food Insecurity: No Food Insecurity (07/14/2021)   Hunger Vital Sign   . Worried About Charity fundraiser in the Last Year: Never true   . Ran Out of Food in the Last Year: Never true  Transportation Needs: No Transportation Needs (07/14/2021)   PRAPARE - Transportation   . Lack of Transportation (Medical): No   . Lack of Transportation (Non-Medical): No  Physical Activity: Sufficiently Active (07/14/2021)   Exercise Vital Sign   . Days of Exercise per Week:  7 days   . Minutes of Exercise per Session: 60 min  Stress: No Stress Concern Present (07/14/2021)   [place]   . Feeling of Stress : Not at all  Social Connections: Socially Integrated (07/14/2021)   Social Connection and Isolation Panel [NHANES]   . Frequency of Communication with Friends and Family: More than three times a week   . Frequency of Social Gatherings with Friends and Family: More than three times a week   . Attends Religious Services: More than 4 times per year   . Active Member of Clubs or Organizations: Yes   . Attends Archivist Meetings: More than 4 times per year   . Marital Status: Married  Human resources officer Violence: Not At Risk (07/14/2021)   Humiliation, Afraid, Rape, and Kick questionnaire   . Fear of Current or Ex-Partner: No   . Emotionally Abused: No   . Physically Abused: No   . Sexually Abused: No    Family History  Problem Relation Age of Onset  . Dementia Father   . Colon cancer Neg Hx   . Colon polyps Neg Hx   . Esophageal cancer Neg Hx   . Rectal cancer Neg Hx   . Stomach cancer Neg Hx      Review of Systems  Constitutional: Negative.  Negative for chills and fever.  HENT: Negative.  Negative for congestion and sore throat.   Respiratory:  Negative for cough and shortness of breath.   Gastrointestinal:  Negative for abdominal pain, nausea and vomiting.  Skin: Negative.  Negative for rash.  Neurological:  Negative for dizziness and headaches.  All other systems reviewed and are negative.   Today's Vitals   03/28/22 1310  BP: 132/72  Pulse: 63  Temp: 97.6 F (36.4 C)  TempSrc: Oral  SpO2: 98%  Weight: 139 lb (63 kg)  Height: '5\' 10"'$  (1.778 m)   Body mass index is 19.94 kg/m.  Physical Exam Constitutional:      Appearance: Normal appearance.  HENT:     Head: Normocephalic.  Eyes:     Extraocular Movements: Extraocular movements intact.  Cardiovascular:      Rate and Rhythm: Normal rate.  Pulmonary:     Effort: Pulmonary effort is normal.  Skin:    Comments: Slightly erythematous right nipple with palpable lump underneath, tender to touch  Neurological:     Mental Status: He is alert and oriented to person, place, and time.  Psychiatric:        Mood and Affect: Mood normal.        Behavior: Behavior normal.     ASSESSMENT & PLAN:  A total of 33 minutes was spent with the patient and counseling/coordination of care regarding preparing for this visit, review of most recent office visit notes, differential diagnosis of nipple mass and need for diagnostic work-up including mammogram, review of all medications, review of health maintenance items, prognosis, documentation and need for follow-up after mammogram.  Problem List Items Addressed This Visit       Other   Subareolar mass of right breast - Primary    Differential diagnosis including cancer discussed. Needs diagnostic work-up Mammogram requested today.       Relevant Orders   MM Digital Diagnostic Unilat R   Patient Instructions  You have a lump on your right nipple which needs a diagnostic work-up. You will be scheduled for mammogram.  Health Maintenance After Age 86 After age 64, you are at a higher risk for certain long-term diseases and infections as well as injuries from falls. Falls are a major cause of broken bones and head injuries in people who are older than age 42. Getting regular preventive care can help to keep you healthy and well. Preventive care includes getting regular testing and making lifestyle changes as recommended by your health care provider. Talk with your health care provider about: Which screenings and tests you should have. A screening is a test that checks for a disease when you have no symptoms. A diet and exercise plan that is right for you. What should I know about screenings and tests to prevent falls? Screening and testing are the best ways  to find a health problem early. Early diagnosis and treatment give you the best chance of managing medical conditions that are common after age 18. Certain conditions and lifestyle choices may make you more likely to have a fall. Your health care provider may recommend: Regular vision checks. Poor vision and conditions such as cataracts can make you more likely to have a fall. If you wear glasses, make sure to get your prescription updated if your vision changes. Medicine review. Work with your health care provider to regularly review all of the medicines you are taking, including over-the-counter medicines. Ask your health care provider about any side effects that may make you more likely to have a fall. Tell your health care provider if any medicines that you take make you feel dizzy or sleepy. Strength and balance checks. Your health care provider may recommend certain tests to check your strength and balance while standing, walking, or changing positions. Foot health exam. Foot pain and numbness, as well as not wearing proper footwear, can make you more likely to have a fall. Screenings, including: Osteoporosis screening. Osteoporosis is a condition that causes the bones to get weaker and break more easily. Blood pressure screening. Blood pressure changes and medicines to control blood pressure can make you feel dizzy. Depression screening. You may be more likely to have a fall if you have a fear of falling, feel depressed, or feel unable to do activities that you used to do. Alcohol use screening. Using too much alcohol can affect your balance and may make you more likely to have a fall. Follow these instructions at home: Lifestyle Do not drink alcohol if: Your health care provider tells you not to drink. If you drink alcohol: Limit how much you have to: 0-1 drink a day for women. 0-2 drinks a day for men. Know how much alcohol is in your drink. In the [place] one drink equals one 12 oz bottle  of beer (355 mL),  one 5 oz glass of wine (148 mL), or one 1 oz glass of hard liquor (44 mL). Do not use any products that contain nicotine or tobacco. These products include cigarettes, chewing tobacco, and vaping devices, such as e-cigarettes. If you need help quitting, ask your health care provider. Activity  Follow a regular exercise program to stay fit. This will help you maintain your balance. Ask your health care provider what types of exercise are appropriate for you. If you need a cane or walker, use it as recommended by your health care provider. Wear supportive shoes that have nonskid soles. Safety  Remove any tripping hazards, such as rugs, cords, and clutter. Install safety equipment such as grab bars in bathrooms and safety rails on stairs. Keep rooms and walkways well-lit. General instructions Talk with your health care provider about your risks for falling. Tell your health care provider if: You fall. Be sure to tell your health care provider about all falls, even ones that seem minor. You feel dizzy, tiredness (fatigue), or off-balance. Take over-the-counter and prescription medicines only as told by your health care provider. These include supplements. Eat a healthy diet and maintain a healthy weight. A healthy diet includes low-fat dairy products, low-fat (lean) meats, and fiber from whole grains, beans, and lots of fruits and vegetables. Stay current with your vaccines. Schedule regular health, dental, and eye exams. Summary Having a healthy lifestyle and getting preventive care can help to protect your health and wellness after age 84. Screening and testing are the best way to find a health problem early and help you avoid having a fall. Early diagnosis and treatment give you the best chance for managing medical conditions that are more common for people who are older than age 11. Falls are a major cause of broken bones and head injuries in people who are older than age  65. Take precautions to prevent a fall at home. Work with your health care provider to learn what changes you can make to improve your health and wellness and to prevent falls. This information is not intended to replace advice given to you by your health care provider. Make sure you discuss any questions you have with your health care provider. Document Revised: 01/18/2021 Document Reviewed: 01/18/2021 Elsevier Patient Education  [place], MD [facility]

## 2022-03-28 NOTE — Assessment & Plan Note (Signed)
Differential diagnosis including cancer discussed. Needs diagnostic work-up Mammogram requested today.

## 2022-03-28 NOTE — Patient Instructions (Signed)
You have a lump on your right nipple which needs a diagnostic work-up. You will be scheduled for mammogram.  Health Maintenance After Age 71 After age 33, you are at a higher risk for certain long-term diseases and infections as well as injuries from falls. Falls are a major cause of broken bones and head injuries in people who are older than age 67. Getting regular preventive care can help to keep you healthy and well. Preventive care includes getting regular testing and making lifestyle changes as recommended by your health care provider. Talk with your health care provider about: Which screenings and tests you should have. A screening is a test that checks for a disease when you have no symptoms. A diet and exercise plan that is right for you. What should I know about screenings and tests to prevent falls? Screening and testing are the best ways to find a health problem early. Early diagnosis and treatment give you the best chance of managing medical conditions that are common after age 16. Certain conditions and lifestyle choices may make you more likely to have a fall. Your health care provider may recommend: Regular vision checks. Poor vision and conditions such as cataracts can make you more likely to have a fall. If you wear glasses, make sure to get your prescription updated if your vision changes. Medicine review. Work with your health care provider to regularly review all of the medicines you are taking, including over-the-counter medicines. Ask your health care provider about any side effects that may make you more likely to have a fall. Tell your health care provider if any medicines that you take make you feel dizzy or sleepy. Strength and balance checks. Your health care provider may recommend certain tests to check your strength and balance while standing, walking, or changing positions. Foot health exam. Foot pain and numbness, as well as not wearing proper footwear, can make you more  likely to have a fall. Screenings, including: Osteoporosis screening. Osteoporosis is a condition that causes the bones to get weaker and break more easily. Blood pressure screening. Blood pressure changes and medicines to control blood pressure can make you feel dizzy. Depression screening. You may be more likely to have a fall if you have a fear of falling, feel depressed, or feel unable to do activities that you used to do. Alcohol use screening. Using too much alcohol can affect your balance and may make you more likely to have a fall. Follow these instructions at home: Lifestyle Do not drink alcohol if: Your health care provider tells you not to drink. If you drink alcohol: Limit how much you have to: 0-1 drink a day for women. 0-2 drinks a day for men. Know how much alcohol is in your drink. In the [place] one drink equals one 12 oz bottle of beer (355 mL), one 5 oz glass of wine (148 mL), or one 1 oz glass of hard liquor (44 mL). Do not use any products that contain nicotine or tobacco. These products include cigarettes, chewing tobacco, and vaping devices, such as e-cigarettes. If you need help quitting, ask your health care provider. Activity  Follow a regular exercise program to stay fit. This will help you maintain your balance. Ask your health care provider what types of exercise are appropriate for you. If you need a cane or walker, use it as recommended by your health care provider. Wear supportive shoes that have nonskid soles. Safety  Remove any tripping hazards, such as rugs, cords,  cords, and clutter. Install safety equipment such as grab bars in bathrooms and safety rails on stairs. Keep rooms and walkways well-lit. General instructions Talk with your health care provider about your risks for falling. Tell your health care provider if: You fall. Be sure to tell your health care provider about all falls, even ones that seem minor. You feel dizzy, tiredness (fatigue), or  off-balance. Take over-the-counter and prescription medicines only as told by your health care provider. These include supplements. Eat a healthy diet and maintain a healthy weight. A healthy diet includes low-fat dairy products, low-fat (lean) meats, and fiber from whole grains, beans, and lots of fruits and vegetables. Stay current with your vaccines. Schedule regular health, dental, and eye exams. Summary Having a healthy lifestyle and getting preventive care can help to protect your health and wellness after age 65. Screening and testing are the best way to find a health problem early and help you avoid having a fall. Early diagnosis and treatment give you the best chance for managing medical conditions that are more common for people who are older than age 65. Falls are a major cause of broken bones and head injuries in people who are older than age 65. Take precautions to prevent a fall at home. Work with your health care provider to learn what changes you can make to improve your health and wellness and to prevent falls. This information is not intended to replace advice given to you by your health care provider. Make sure you discuss any questions you have with your health care provider. Document Revised: 01/18/2021 Document Reviewed: 01/18/2021 Elsevier Patient Education  2023 [employer]  

## 2022-04-01 ENCOUNTER — Ambulatory Visit
Admission: RE | Admit: 2022-04-01 | Discharge: 2022-04-01 | Disposition: A | Discharge status: Home / Self Care | Payer: PPO | Source: Ambulatory Visit | Attending: Emergency Medicine | Admitting: Emergency Medicine

## 2022-04-01 DIAGNOSIS — N6341 Unspecified lump in right breast, subareolar: Secondary | ICD-10-CM

## 2022-04-01 DIAGNOSIS — N62 Hypertrophy of breast: Secondary | ICD-10-CM | POA: Diagnosis not present

## 2022-07-13 DIAGNOSIS — H25812 Combined forms of age-related cataract, left eye: Secondary | ICD-10-CM | POA: Diagnosis not present

## 2022-07-25 ENCOUNTER — Ambulatory Visit: Payer: PPO

## 2022-07-25 DIAGNOSIS — H25812 Combined forms of age-related cataract, left eye: Secondary | ICD-10-CM | POA: Diagnosis not present

## 2022-07-25 DIAGNOSIS — H269 Unspecified cataract: Secondary | ICD-10-CM | POA: Diagnosis not present

## 2022-07-25 DIAGNOSIS — H2512 Age-related nuclear cataract, left eye: Secondary | ICD-10-CM | POA: Diagnosis not present

## 2022-07-25 NOTE — Progress Notes (Unsigned)
Subjective:   Cory Thomas is a 71 y.o. male who presents for Medicare Annual/Subsequent preventive examination. I connected with  Cory Thomas on 07/26/22 by a audio enabled telemedicine application and verified that I am speaking with the correct person using two identifiers.  Patient Location: Home  Provider Location: Home Office  I discussed the limitations of evaluation and management by telemedicine. The patient expressed understanding and agreed to proceed.  Review of Systems    Deferred to PCP Cardiac Risk Factors include: advanced age (>52mn, >>48women)     Objective:    There were no vitals filed for this visit. There is no height or weight on file to calculate BMI.     07/26/2022    2:38 PM 07/14/2021    1:24 PM 06/24/2020    7:23 AM 06/16/2020    1:49 PM 05/04/2020   11:10 AM 03/05/2020    9:31 AM 01/14/2019   10:15 AM  Advanced Directives  Does Patient Have a Medical Advance Directive? Yes Yes Yes Yes Yes Yes Yes  Type of AParamedicof [place] will  [place] will  HU[place]Living will [place]ill[place] will Living will  Does patient want to make changes to medical advance directive? No - Patient declined No - Patient declined No - Patient declined Yes (MAU/Ambulatory/Procedural Areas - Information given)   No - Patient declined  Copy of [place] Chart? Yes - validated most recent copy scanned in chart (See row information)  Yes - validated most recent copy scanned in chart (See row information)  Yes - validated most recent copy scanned in chart (See row information) Yes - validated most recent copy scanned in chart (See row information)     Current Medications (verified) Outpatient Encounter Medications as of 07/26/2022  Medication Sig   Alpha-D-Galactosidase (BEANO) TABS Take 2 tablets by mouth daily. 800 Galu   calcium carbonate  (OS-CAL - DOSED IN MG OF ELEMENTAL CALCIUM) 1250 (500 Ca) MG tablet Take 1 tablet by mouth.   Cholecalciferol (VITAMIN D3) 50 MCG (2000 UT) TABS Take 6,000 Units by mouth daily.   zinc gluconate 50 MG tablet Take 25 mg by mouth daily.   No facility-administered encounter medications on file as of 07/26/2022.    Allergies (verified) Other   History: Past Medical History:  Diagnosis Date   Allergy    Anxiety    per pt after father died   Asthma    age 71   [facility]    [facility] on arms and legs years ago rmoved over 10 yrs ago   Gastritis    years ago   Osteoporosis    Refusal of blood product    Past Surgical History:  Procedure Laterality Date   CLOSED REDUCTION WRIST FRACTURE     71years old   ESOPHAGOGASTRODUODENOSCOPY N/A 03/05/2020   Procedure: ESOPHAGOGASTRODUODENOSCOPY (EGD);  Surgeon: JMilus Banister MD;  [facility];  Service: Endoscopy;  Laterality: N/A;   EUS N/A 03/05/2020   Procedure: UPPER ENDOSCOPIC ULTRASOUND (EUS) LINEAR;  Surgeon: JMilus Banister MD;  Location[facility];  Service: Endoscopy;  Laterality: N/A;   FINE NEEDLE ASPIRATION N/A 03/05/2020   Procedure: FINE NEEDLE ASPIRATION (FNA) LINEAR;  Surgeon: JMilus Banister MD;  Location: [facility];  Service: Endoscopy;  Laterality: N/A;   POLYPECTOMY  12/2018   colon   TONSILLECTOMY     WISDOM TOOTH EXTRACTION  Family History  Problem Relation Age of Onset   Dementia Father    Colon cancer Neg Hx    Colon polyps Neg Hx    Esophageal cancer Neg Hx    Rectal cancer Neg Hx    Stomach cancer Neg Hx    Social History   Socioeconomic History   Marital status: Married    Spouse name: Electrical engineer   Number of children: Not on file   Years of education: Not on file   Highest education level: Not on file  Occupational History   Occupation: Probation officer  Tobacco Use   Smoking status: Never   Smokeless tobacco: Never  Vaping Use   Vaping Use: Never used  Substance and Sexual  Activity   Alcohol use: Yes    Alcohol/week: 3.0 standard drinks of alcohol    Types: 3 Glasses of Quorra Rosene per week    Comment: 1-2 bottles of beer/week, red Michaelah Credeur   Drug use: Never   Sexual activity: Not on file  Other Topics Concern   Not on file  Social History Narrative   Jehovah's Witness   [place]   Social Determinants of Health   Financial Resource Strain: Low Risk  (07/26/2022)   Overall Financial Resource Strain (CARDIA)    Difficulty of Paying Living Expenses: Not hard at all  Food Insecurity: No Food Insecurity (07/26/2022)   Hunger Vital Sign    Worried About Running Out of Food in the Last Year: Never true    Ran Out of Food in the Last Year: Never true  Transportation Needs: No Transportation Needs (07/26/2022)   PRAPARE - Hydrologist (Medical): No    Lack of Transportation (Non-Medical): No  Physical Activity: Sufficiently Active (07/26/2022)   Exercise Vital Sign    Days of Exercise per Week: 7 days    Minutes of Exercise per Session: 150+ min  Stress: No Stress Concern Present (07/26/2022)   [place]    Feeling of Stress : Not at all  Social Connections: Socially Integrated (07/26/2022)   Social Connection and Isolation Panel [NHANES]    Frequency of Communication with Friends and Family: More than three times a week    Frequency of Social Gatherings with Friends and Family: More than three times a week    Attends Religious Services: More than 4 times per year    Active Member of [employer] or Organizations: Yes    Attends Music therapist: More than 4 times per year    Marital Status: Married    Tobacco Counseling Counseling given: Not Answered   Clinical Intake:  Pre-visit preparation completed: Yes  Pain : No/denies pain     Nutritional Status: BMI <19  Underweight Nutritional Risks: None Diabetes: No  How often do you  need to have someone help you when you read instructions, pamphlets, or other written materials from your doctor or pharmacy?: 1 - Never What is the last grade level you completed in school?: college  Diabetic?No  Interpreter Needed?: No  Information entered by :: Emelia Loron RN   Activities of Daily Living    07/26/2022    2:14 PM  In your present state of health, do you have any difficulty performing the following activities:  Hearing? 0  Vision? 0  Difficulty concentrating or making decisions? 0  Walking or climbing stairs? 0  Dressing or bathing? 0  Doing errands,  shopping? 0  Preparing Food and eating ? N  Using the Toilet? N  In the past six months, have you accidently leaked urine? N  Do you have problems with loss of bowel control? N  Managing your Medications? N  Managing your Finances? N  Housekeeping or managing your Housekeeping? N    Patient Care Team: Horald Pollen, MD as PCP - General (Internal Medicine) Michael Boston, MD as Consulting Physician (General Surgery) Armbruster, [name], MD as Consulting Physician (Gastroenterology)  Indicate any recent Medical Services you may have received from other than [facility] providers in the past year (date may be approximate).     Assessment:   This is a routine wellness examination for [place].  Hearing/Vision screen No results found.  Dietary issues and exercise activities discussed: Current Exercise Habits: Home exercise routine, Type of exercise: walking;strength training/weights;calisthenics, Time (Minutes): > 60, Frequency (Times/Week): >7, Weekly Exercise (Minutes/Week): 0, Intensity: Mild, Exercise limited by: None identified   Goals Addressed             This Visit's Progress    Patient Stated       Increase my water intake by being conscious of how much I am drinking.      Depression Screen    07/26/2022    2:16 PM 03/28/2022    1:14 PM 02/09/2022    8:00 AM 07/14/2021    1:23 PM 01/21/2021    10:45 AM 11/05/2019    8:18 AM 01/14/2019   10:12 AM  PHQ 2/9 Scores  PHQ - 2 Score 0 0 0 0 0 0 0    Fall Risk    07/26/2022    2:15 PM 03/28/2022    1:14 PM 02/09/2022    8:00 AM 07/14/2021    1:25 PM 01/21/2021   10:45 AM  Fall Risk   Falls in the past year? 0 1 0 0 0  Number falls in past yr: 0 0  0 0  Injury with Fall? 0 0  0 0  Risk for fall due to : No Fall Risks   No Fall Risks   Follow up Falls evaluation completed   Falls evaluation completed     FALL RISK PREVENTION PERTAINING TO THE HOME:  Any stairs in or around the home? Yes  If so, are there any without handrails? Yes  Home free of loose throw rugs in walkways, pet beds, electrical cords, etc? Yes  Adequate lighting in your home to reduce risk of falls? Yes   ASSISTIVE DEVICES UTILIZED TO PREVENT FALLS:  Life alert? No  Use of a cane, walker or w/c? No  Grab bars in the bathroom? No  Shower chair or bench in shower? No  Elevated toilet seat or a handicapped toilet? No   Cognitive Function:    07/26/2022    2:39 PM  MMSE - Mini Mental State Exam  Not completed: Refused        01/14/2019   10:13 AM  6CIT Screen  What Year? 0 points  What month? 0 points  What time? 0 points  Count back from 20 0 points  Months in reverse 0 points  Repeat phrase 0 points  Total Score 0 points    Immunizations Immunization History  Administered Date(s) Administered   Fluad Quad(high Dose 65+) 07/08/2019, 06/16/2021   Hepatitis A 10/06/1997   Hepatitis A, Adult 12/06/2017   Influenza, High Dose Seasonal PF 07/06/2017, 07/11/2018   Influenza-Unspecified 06/17/2015, 07/11/2018, 06/16/2021   PFIZER(Purple  Top)SARS-COV-2 Vaccination 10/04/2019, 11/03/2019, 06/08/2020   Pfizer Covid-19 Vaccine Bivalent Booster 69yr & up 05/20/2021   Pneumococcal Conjugate-13 09/10/2016   Td 10/06/1997   Tdap 06/17/2015   Typhoid Inactivated 12/07/2017   Zoster Recombinat (Shingrix) 04/23/2021, 09/17/2021    TDAP status: Up to  date  Flu Vaccine status: Due, Education has been provided regarding the importance of this vaccine. Advised may receive this vaccine at local pharmacy or Health Dept. Aware to provide a copy of the vaccination record if obtained from local pharmacy or Health Dept. Verbalized acceptance and understanding.  Pneumococcal vaccine status: Up to date  Covid-19 vaccine status: Information provided on how to obtain vaccines.   Qualifies for Shingles Vaccine? Yes   Zostavax completed No   Shingrix Completed?: Yes  Screening Tests Health Maintenance  Topic Date Due   COVID-19 Vaccine (5 - Pfizer risk series) 07/15/2021   INFLUENZA VACCINE  12/11/2022 (Originally 04/12/2022)   COLONOSCOPY (Pts 45-470yrInsurance coverage will need to be confirmed)  03/30/2023   Medicare Annual Wellness (AWV)  07/27/2023   TETANUS/TDAP  06/16/2025   Pneumonia Vaccine 6574Years old  Completed   Hepatitis C Screening  Completed   Zoster Vaccines- Shingrix  Completed   HPV VACCINES  Aged Out    Health Maintenance  Health Maintenance Due  Topic Date Due   COVID-19 Vaccine (5 - Pfizer risk series) 07/15/2021    Colorectal cancer screening: Type of screening: Colonoscopy. Completed 03/29/18. Repeat every 5 years  Lung Cancer Screening: (Low Dose CT Chest recommended if Age 71-80ears, 30 pack-year currently smoking OR have quit w/in 15years.) does not qualify.   Additional Screening:  Hepatitis C Screening: does qualify; Completed 01/09/18  Vision Screening: Recommended annual ophthalmology exams for early detection of glaucoma and other disorders of the eye. Is the patient up to date with their annual eye exam?  Yes  Who is the provider or what is the name of the office in which the patient attends annual eye exams? Dr. HeHerbert Deanerf pt is not established with a provider, would they like to be referred to a provider to establish care?  N/A .   Dental Screening: Recommended annual dental exams for proper oral  hygiene  Community Resource Referral / Chronic Care Management: CRR required this visit?  No   CCM required this visit?  No      Plan:     I have personally reviewed and noted the following in the patient's chart:   Medical and social history Use of alcohol, tobacco or illicit drugs  Current medications and supplements including opioid prescriptions. Patient is not currently taking opioid prescriptions. Functional ability and status Nutritional status Physical activity Advanced directives List of other physicians Hospitalizations, surgeries, and ER visits in previous 12 months Vitals Screenings to include cognitive, depression, and falls Referrals and appointments  In addition, I have reviewed and discussed with patient certain preventive protocols, quality metrics, and best practice recommendations. A written personalized care plan for preventive services as well as general preventive health recommendations were provided to patient.     JiMichiel CowboyRN   07/26/2022   Nurse Notes:  Mr. HoSar Thank you for taking time to come for your Medicare Wellness Visit. I appreciate your ongoing commitment to your health goals. Please review the following plan we discussed and let me know if I can assist you in the future.   These are the goals we discussed:  Goals  Exercise 150 min/wk Moderate Activity     Patient Stated     Increase my water intake by being conscious of how much I am drinking.        This is a list of the screening recommended for you and due dates:  Health Maintenance  Topic Date Due   COVID-19 Vaccine (5 - Pfizer risk series) 07/15/2021   Flu Shot  12/11/2022*   Colon Cancer Screening  03/30/2023   Medicare Annual Wellness Visit  07/27/2023   Tetanus Vaccine  06/16/2025   Pneumonia Vaccine  Completed   Hepatitis C Screening: USPSTF Recommendation to screen - Ages 18-79 yo.  Completed   Zoster (Shingles) Vaccine  Completed   HPV Vaccine  Aged  Out  *Topic was postponed. The date shown is not the original due date.

## 2022-07-25 NOTE — Patient Instructions (Signed)
Health Maintenance, Male Adopting a healthy lifestyle and getting preventive care are important in promoting health and wellness. Ask your health care provider about: The right schedule for you to have regular tests and exams. Things you can do on your own to prevent diseases and keep yourself healthy. What should I know about diet, weight, and exercise? Eat a healthy diet  Eat a diet that includes plenty of vegetables, fruits, low-fat dairy products, and lean protein. Do not eat a lot of foods that are high in solid fats, added sugars, or sodium. Maintain a healthy weight Body mass index (BMI) is a measurement that can be used to identify possible weight problems. It estimates body fat based on height and weight. Your health care provider can help determine your BMI and help you achieve or maintain a healthy weight. Get regular exercise Get regular exercise. This is one of the most important things you can do for your health. Most adults should: Exercise for at least 150 minutes each week. The exercise should increase your heart rate and make you sweat (moderate-intensity exercise). Do strengthening exercises at least twice a week. This is in addition to the moderate-intensity exercise. Spend less time sitting. Even light physical activity can be beneficial. Watch cholesterol and blood lipids Have your blood tested for lipids and cholesterol at 71 years of age, then have this test every 5 years. You may need to have your cholesterol levels checked more often if: Your lipid or cholesterol levels are high. You are older than 71 years of age. You are at high risk for heart disease. What should I know about cancer screening? Many types of cancers can be detected early and may often be prevented. Depending on your health history and family history, you may need to have cancer screening at various ages. This may include screening for: Colorectal cancer. Prostate cancer. Skin cancer. Lung  cancer. What should I know about heart disease, diabetes, and high blood pressure? Blood pressure and heart disease High blood pressure causes heart disease and increases the risk of stroke. This is more likely to develop in people who have high blood pressure readings or are overweight. Talk with your health care provider about your target blood pressure readings. Have your blood pressure checked: Every 3-5 years if you are 18-39 years of age. Every year if you are 40 years old or older. If you are between the ages of 65 and 75 and are a current or former smoker, ask your health care provider if you should have a one-time screening for abdominal aortic aneurysm (AAA). Diabetes Have regular diabetes screenings. This checks your fasting blood sugar level. Have the screening done: Once every three years after age 45 if you are at a normal weight and have a low risk for diabetes. More often and at a younger age if you are overweight or have a high risk for diabetes. What should I know about preventing infection? Hepatitis B If you have a higher risk for hepatitis B, you should be screened for this virus. Talk with your health care provider to find out if you are at risk for hepatitis B infection. Hepatitis C Blood testing is recommended for: Everyone born from 1945 through 1965. Anyone with known risk factors for hepatitis C. Sexually transmitted infections (STIs) You should be screened each year for STIs, including gonorrhea and chlamydia, if: You are sexually active and are younger than 71 years of age. You are older than 71 years of age and your   health care provider tells you that you are at risk for this type of infection. Your sexual activity has changed since you were last screened, and you are at increased risk for chlamydia or gonorrhea. Ask your health care provider if you are at risk. Ask your health care provider about whether you are at high risk for HIV. Your health care provider  may recommend a prescription medicine to help prevent HIV infection. If you choose to take medicine to prevent HIV, you should first get tested for HIV. You should then be tested every 3 months for as long as you are taking the medicine. Follow these instructions at home: Alcohol use Do not drink alcohol if your health care provider tells you not to drink. If you drink alcohol: Limit how much you have to 0-2 drinks a day. Know how much alcohol is in your drink. In the [place] one drink equals one 12 oz bottle of beer (355 mL), one 5 oz glass of Asher Babilonia (148 mL), or one 1 oz glass of hard liquor (44 mL). Lifestyle Do not use any products that contain nicotine or tobacco. These products include cigarettes, chewing tobacco, and vaping devices, such as e-cigarettes. If you need help quitting, ask your health care provider. Do not use street drugs. Do not share needles. Ask your health care provider for help if you need support or information about quitting drugs. General instructions Schedule regular health, dental, and eye exams. Stay current with your vaccines. Tell your health care provider if: You often feel depressed. You have ever been abused or do not feel safe at home. Summary Adopting a healthy lifestyle and getting preventive care are important in promoting health and wellness. Follow your health care provider's instructions about healthy diet, exercising, and getting tested or screened for diseases. Follow your health care provider's instructions on monitoring your cholesterol and blood pressure. This information is not intended to replace advice given to you by your health care provider. Make sure you discuss any questions you have with your health care provider. Document Revised: 01/18/2021 Document Reviewed: 01/18/2021 Elsevier Patient Education  2023 [employer]  

## 2022-07-26 ENCOUNTER — Ambulatory Visit (INDEPENDENT_AMBULATORY_CARE_PROVIDER_SITE_OTHER): Payer: PPO | Admitting: *Deleted

## 2022-07-26 DIAGNOSIS — Z0000 Encounter for general adult medical examination without abnormal findings: Secondary | ICD-10-CM | POA: Diagnosis not present

## 2022-08-01 DIAGNOSIS — H269 Unspecified cataract: Secondary | ICD-10-CM | POA: Diagnosis not present

## 2022-08-01 DIAGNOSIS — H25811 Combined forms of age-related cataract, right eye: Secondary | ICD-10-CM | POA: Diagnosis not present

## 2022-08-03 ENCOUNTER — Encounter: Payer: Self-pay | Admitting: Emergency Medicine

## 2022-08-30 DIAGNOSIS — Z85828 Personal history of other malignant neoplasm of skin: Secondary | ICD-10-CM | POA: Diagnosis not present

## 2022-08-30 DIAGNOSIS — Z08 Encounter for follow-up examination after completed treatment for malignant neoplasm: Secondary | ICD-10-CM | POA: Diagnosis not present

## 2022-08-30 DIAGNOSIS — L814 Other melanin hyperpigmentation: Secondary | ICD-10-CM | POA: Diagnosis not present

## 2022-08-30 DIAGNOSIS — L570 Actinic keratosis: Secondary | ICD-10-CM | POA: Diagnosis not present

## 2022-08-30 DIAGNOSIS — L821 Other seborrheic keratosis: Secondary | ICD-10-CM | POA: Diagnosis not present

## 2022-08-30 DIAGNOSIS — D1801 Hemangioma of skin and subcutaneous tissue: Secondary | ICD-10-CM | POA: Diagnosis not present

## 2022-08-30 DIAGNOSIS — Z86006 Personal history of melanoma in-situ: Secondary | ICD-10-CM | POA: Diagnosis not present

## 2022-08-31 ENCOUNTER — Ambulatory Visit: Payer: PPO | Admitting: Dermatology

## 2022-09-26 ENCOUNTER — Encounter: Payer: Self-pay | Admitting: Emergency Medicine

## 2022-11-23 ENCOUNTER — Encounter: Payer: Self-pay | Admitting: Emergency Medicine

## 2022-11-23 DIAGNOSIS — R7303 Prediabetes: Secondary | ICD-10-CM

## 2022-11-23 NOTE — Telephone Encounter (Signed)
Okay to refer to requested nutritionist

## 2022-11-27 ENCOUNTER — Encounter (HOSPITAL_BASED_OUTPATIENT_CLINIC_OR_DEPARTMENT_OTHER): Payer: Self-pay

## 2022-11-27 ENCOUNTER — Other Ambulatory Visit: Payer: Self-pay

## 2022-11-27 ENCOUNTER — Emergency Department (HOSPITAL_BASED_OUTPATIENT_CLINIC_OR_DEPARTMENT_OTHER)
Admission: EM | Admit: 2022-11-27 | Discharge: 2022-11-27 | Disposition: A | Discharge status: Home / Self Care | Payer: PPO | Attending: Emergency Medicine | Admitting: Emergency Medicine

## 2022-11-27 DIAGNOSIS — R9431 Abnormal electrocardiogram [ECG] [EKG]: Secondary | ICD-10-CM | POA: Diagnosis not present

## 2022-11-27 DIAGNOSIS — R7309 Other abnormal glucose: Secondary | ICD-10-CM | POA: Diagnosis not present

## 2022-11-27 DIAGNOSIS — R112 Nausea with vomiting, unspecified: Secondary | ICD-10-CM | POA: Insufficient documentation

## 2022-11-27 DIAGNOSIS — R1110 Vomiting, unspecified: Secondary | ICD-10-CM | POA: Diagnosis present

## 2022-11-27 LAB — COMPREHENSIVE METABOLIC PANEL
ALT: 22 U/L (ref 0–44)
AST: 26 U/L (ref 15–41)
Albumin: 4.5 g/dL (ref 3.5–5.0)
Alkaline Phosphatase: 81 U/L (ref 38–126)
Anion gap: 10 (ref 5–15)
BUN: 28 mg/dL — ABNORMAL HIGH (ref 8–23)
CO2: 26 mmol/L (ref 22–32)
Calcium: 9.5 mg/dL (ref 8.9–10.3)
Chloride: 102 mmol/L (ref 98–111)
Creatinine, Ser: 0.86 mg/dL (ref 0.61–1.24)
GFR, Estimated: >60 mL/min (ref >60)
Glucose, Bld: 140 mg/dL — ABNORMAL HIGH (ref 70–99)
Potassium: 4.2 mmol/L (ref 3.5–5.1)
Sodium: 138 mmol/L (ref 135–145)
Total Bilirubin: 0.7 mg/dL (ref 0.3–1.2)
Total Protein: 7.3 g/dL (ref 6.5–8.1)

## 2022-11-27 LAB — CBC
HCT: 49.2 % (ref 39.0–52.0)
Hemoglobin: 16.5 g/dL (ref 13.0–17.0)
MCH: 30 pg (ref 26.0–34.0)
MCHC: 33.5 g/dL (ref 30.0–36.0)
MCV: 89.5 fL (ref 80.0–100.0)
Platelets: 239 10*3/uL (ref 150–400)
RBC: 5.5 MIL/uL (ref 4.22–5.81)
RDW: 12.5 % (ref 11.5–15.5)
WBC: 14.4 10*3/uL — ABNORMAL HIGH (ref 4.0–10.5)
nRBC: 0 % (ref 0.0–0.2)

## 2022-11-27 LAB — CBG MONITORING, ED: Glucose-Capillary: 131 mg/dL — ABNORMAL HIGH (ref 70–99)

## 2022-11-27 LAB — LIPASE, BLOOD: Lipase: 27 U/L (ref 11–51)

## 2022-11-27 MED ORDER — ONDANSETRON 4 MG PO TBDP: 4.0000 mg | ORAL_TABLET | Freq: Three times a day (TID) | ORAL | 0 refills | Status: DC | PRN

## 2022-11-27 MED ORDER — SODIUM CHLORIDE 0.9 % IV BOLUS
1000.0000 mL | Freq: Once | INTRAVENOUS | Status: AC
  Administered 2022-11-27: 1000 mL via INTRAVENOUS

## 2022-11-27 MED ORDER — ONDANSETRON HCL 4 MG/2ML IJ SOLN
4.0000 mg | Freq: Once | INTRAMUSCULAR | Status: AC | PRN
  Administered 2022-11-27: 4 mg via INTRAVENOUS
  Filled 2022-11-27: qty 2

## 2022-11-27 MED ORDER — ONDANSETRON HCL 4 MG/2ML IJ SOLN: 4.0000 mg | Freq: Once | INTRAMUSCULAR | Status: DC

## 2022-11-27 NOTE — ED Notes (Signed)
Discharge paperwork given and verbally understood. 

## 2022-11-27 NOTE — ED Provider Notes (Signed)
[facility] Provider Note   CSN: [id number] Arrival date & time: 11/27/22  1229     History  Chief Complaint  Patient presents with   Emesis    Cory Thomas is a 72 y.o. male.  Patient here after several episodes of emesis this morning.  Feeling better now.  No abdominal pain.  No diarrhea.  No obvious suspicious food intake.  Denies any chest pain or shortness of breath.  He is feeling much better now.  Denies any fevers or chills.  The history is provided by the patient.       Home Medications Prior to Admission medications   Medication Sig Start Date End Date Taking? Authorizing Provider  ondansetron (ZOFRAN-ODT) 4 MG disintegrating tablet Take 1 tablet (4 mg total) by mouth every 8 (eight) hours as needed for nausea or vomiting. 11/27/22  Yes Evone Arseneau, DO  Alpha-D-Galactosidase (BEANO) TABS Take 2 tablets by mouth daily. 800 Galu    [provider]  calcium carbonate (OS-CAL - DOSED IN MG OF ELEMENTAL CALCIUM) 1250 (500 Ca) MG tablet Take 1 tablet by mouth.    [provider]  Cholecalciferol (VITAMIN D3) 50 MCG (2000 UT) TABS Take 6,000 Units by mouth daily.    [provider]  zinc gluconate 50 MG tablet Take 25 mg by mouth daily.    [provider]      Allergies    Other    Review of Systems   Review of Systems  Physical Exam Updated Vital Signs BP (!) 110/57   Pulse 69   Temp (!) 97.5 F (36.4 C) (Oral)   Resp 17   Ht 5\' 10"  (1.778 m)   Wt 61.2 kg   SpO2 99%   BMI 19.37 kg/m  Physical Exam Vitals and nursing note reviewed.  Constitutional:      General: He is not in acute distress.    Appearance: He is well-developed. He is not ill-appearing.  HENT:     Head: Normocephalic and atraumatic.     Nose: Nose normal.     Mouth/Throat:     Mouth: Mucous membranes are moist.  Eyes:     Extraocular Movements: Extraocular movements intact.     Conjunctiva/sclera:  Conjunctivae normal.     Pupils: Pupils are equal, round, and reactive to light.  Cardiovascular:     Rate and Rhythm: Normal rate and regular rhythm.     Pulses: Normal pulses.     Heart sounds: Normal heart sounds. No murmur heard. Pulmonary:     Effort: Pulmonary effort is normal. No respiratory distress.     Breath sounds: Normal breath sounds.  Abdominal:     Palpations: Abdomen is soft.     Tenderness: There is no abdominal tenderness.  Musculoskeletal:        General: No swelling.     Cervical back: Normal range of motion and neck supple.  Skin:    General: Skin is warm and dry.     Capillary Refill: Capillary refill takes less than 2 seconds.  Neurological:     General: No focal deficit present.     Mental Status: He is alert.  Psychiatric:        Mood and Affect: Mood normal.     ED Results / Procedures / Treatments   Labs (all labs ordered are listed, but only abnormal results are displayed) Labs Reviewed  COMPREHENSIVE METABOLIC PANEL - Abnormal; Notable for the following components:  Result Value   Glucose, Bld 140 (*)    BUN 28 (*)    All other components within normal limits  CBC - Abnormal; Notable for the following components:   WBC 14.4 (*)    All other components within normal limits  CBG MONITORING, ED - Abnormal; Notable for the following components:   Glucose-Capillary 131 (*)    All other components within normal limits  LIPASE, BLOOD    EKG EKG Interpretation  Date/Time:  Sunday November 27 2022 15:41:50 EDT Ventricular Rate:  63 PR Interval:  166 QRS Duration: 102 QT Interval:  437 QTC Calculation: 448 R Axis:   93 Text Interpretation: Sinus rhythm Consider left atrial enlargement Consider right ventricular hypertrophy Confirmed by Mulki Roesler ([id number]) on 11/27/2022 3:49:27 PM  Radiology No results found.  Procedures Procedures    Medications Ordered in ED Medications  ondansetron (ZOFRAN) injection 4 mg (4 mg Intravenous Given  11/27/22 1255)  sodium chloride 0.9 % bolus 1,000 mL (1,000 mLs Intravenous New Bag/Given 11/27/22 1546)    ED Course/ Medical Decision Making/ A&P                             Medical Decision Making Amount and/or Complexity of Data Reviewed Labs: ordered.  Risk Prescription drug management.   [name] is here with nausea and vomiting.  Symptoms now resolved.  Normal vitals.  No fever.  No significant medical history.  Differential diagnosis likely viral process but will check labs to look for dehydration, electrolyte abnormality, liver or gallbladder dysfunction.  CBC, CMP, lipase were ordered and per my review and interpretation are unremarkable.  He is feeling much better after IV fluids and IV Zofran.  I have no concern for intra-abdominal infectious process.  Abdominal exam is benign both on initial evaluation and reevaluation.  Will prescribe Zofran.  Discharged in good condition.  Understands return precautions.  This chart was dictated using voice recognition software.  Despite best efforts to proofread,  errors can occur which can change the documentation meaning.         Final Clinical Impression(s) / ED Diagnoses Final diagnoses:  Nausea and vomiting, unspecified vomiting type    Rx / DC Orders ED Discharge Orders          Ordered    ondansetron (ZOFRAN-ODT) 4 MG disintegrating tablet  Every 8 hours PRN        03 /17/24 [place], [name], DO 11/27/22 1635

## 2022-11-27 NOTE — ED Triage Notes (Signed)
Patient here POV from Home.  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 Felt more nauseous during lecture.   Had an episode of Emesis afterwards that was suspicious for Blood but then he notes he ate blueberries prior to. Has had a few episodes of yellow emesis.   Some Mild ABD Discomfort, Fatigue. No Fever. No Diarrhea.   NAD Noted during Triage. A&Ox4. CGS 15. BIB Wheelchair.

## 2022-11-28 ENCOUNTER — Ambulatory Visit (INDEPENDENT_AMBULATORY_CARE_PROVIDER_SITE_OTHER): Payer: PPO | Admitting: Emergency Medicine

## 2022-11-28 ENCOUNTER — Encounter: Payer: Self-pay | Admitting: Emergency Medicine

## 2022-11-28 VITALS — BP 108/62 | HR 75 | Temp 97.8°F | Ht 70.0 in | Wt 138.2 lb

## 2022-11-28 DIAGNOSIS — K529 Noninfective gastroenteritis and colitis, unspecified: Secondary | ICD-10-CM | POA: Diagnosis not present

## 2022-11-28 DIAGNOSIS — R7303 Prediabetes: Secondary | ICD-10-CM

## 2022-11-28 NOTE — Progress Notes (Signed)
Cory Thomas 72 y.o.   Chief Complaint  Patient presents with   Referral    Patient wants a referral to nutritionist, patient is concerns about his labs     HISTORY OF PRESENT ILLNESS: This is a 72 y.o. male A1A was seen in the emergency department yesterday complaining of nausea and vomiting. Had blood work done.  Results reviewed with patient Much better today.  Back to normal. Also requesting referral to nutritionist. No other complaint or medical concerns today. Wt Readings from Last 3 Encounters:  11/28/22 138 lb 4 oz (62.7 kg)  11/27/22 135 lb (61.2 kg)  03/28/22 139 lb (63 kg)     HPI   Prior to Admission medications   Medication Sig Start Date End Date Taking? Authorizing Provider  Alpha-D-Galactosidase Satira Mccallum) TABS Take 2 tablets by mouth daily. 800 Galu   Yes [provider]  calcium carbonate (OS-CAL - DOSED IN MG OF ELEMENTAL CALCIUM) 1250 (500 Ca) MG tablet Take 1 tablet by mouth.   Yes [provider]  Cholecalciferol (VITAMIN D3) 50 MCG (2000 UT) TABS Take 6,000 Units by mouth daily.   Yes [provider]  zinc gluconate 50 MG tablet Take 25 mg by mouth daily.   Yes [provider]  ondansetron (ZOFRAN-ODT) 4 MG disintegrating tablet Take 1 tablet (4 mg total) by mouth every 8 (eight) hours as needed for nausea or vomiting. 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  06/24/2020     Past Medical History:  Diagnosis Date   Allergy    Anxiety    per pt after father died   Asthma    age 31   Cancer ([place])    skin on arms and legs years ago rmoved over 10 yrs ago   Gastritis    years ago   Osteoporosis    Refusal of blood product     Past Surgical History:  Procedure Laterality Date   CLOSED REDUCTION WRIST FRACTURE     72 years old   ESOPHAGOGASTRODUODENOSCOPY N/A 03/05/2020   Procedure: ESOPHAGOGASTRODUODENOSCOPY (EGD);  Surgeon: Milus Banister, MD;  Location: [facility];  Service: Endoscopy;  Laterality: N/A;   EUS N/A 03/05/2020   Procedure: UPPER ENDOSCOPIC ULTRASOUND (EUS) LINEAR;  Surgeon: Milus Banister, MD;  Location: [facility];  Service: Endoscopy;  Laterality: N/A;   FINE NEEDLE ASPIRATION N/A 03/05/2020   Procedure: FINE NEEDLE ASPIRATION (FNA) LINEAR;  Surgeon: Milus Banister, MD;  Location: [facility];  Service: Endoscopy;  Laterality: N/A;   POLYPECTOMY  12/2018   colon   TONSILLECTOMY     WISDOM TOOTH EXTRACTION      Social History   Socioeconomic History   Marital status: Married    Spouse name: Fraser Din   Number of children: Not on file   Years of education: Not on file   Highest education level: Not on file  Occupational History  Occupation: Probation officer  Tobacco Use   Smoking status: Never   Smokeless tobacco: Never  Vaping Use   Vaping Use: Never used  Substance and Sexual Activity   Alcohol use: Yes    Alcohol/week: 3.0 standard drinks of alcohol    Types: 3 Glasses of wine per week    Comment: 1-2 bottles of beer/week, red wine   Drug use: Never   Sexual activity: Not on file  Other Topics Concern   Not on file  Social History Narrative   Jehovah's Witness   From [place] to hike   Social Determinants of Health   Financial Resource Strain: Low Risk  (07/26/2022)   Overall Financial Resource Strain (CARDIA)    Difficulty of Paying Living Expenses: Not hard at all  Food  Insecurity: No Food Insecurity (07/26/2022)   Hunger Vital Sign    Worried About Running Out of Food in the Last Year: Never true    Ran Out of Food in the Last Year: Never true  Transportation Needs: No Transportation Needs (07/26/2022)   PRAPARE - Hydrologist (Medical): No    Lack of Transportation (Non-Medical): No  Physical Activity: Sufficiently Active (07/26/2022)   Exercise Vital Sign    Days of Exercise per Week: 7 days    Minutes of Exercise per Session: 150+ min  Stress: No Stress Concern Present (07/26/2022)   [place]    Feeling of Stress : Not at all  Social Connections: [place] (07/26/2022)   Social Connection and Isolation Panel [NHANES]    Frequency of Communication with Friends and Family: More than three times a week    Frequency of Social Gatherings with Friends and Family: More than three times a week    Attends Religious Services: More than 4 times per year    Active Member of [employer] or Organizations: Yes    Attends Music therapist: More than 4 times per year    Marital Status: Married  Human resources officer Violence: Not At Risk (07/26/2022)   Humiliation, Afraid, Rape, and Kick questionnaire    Fear of Current or Ex-Partner: No    Emotionally Abused: No    Physically Abused: No    Sexually Abused: No    Family History  Problem Relation Age of Onset   Dementia Father    Colon cancer Neg Hx    Colon polyps Neg Hx    Esophageal cancer Neg Hx    Rectal cancer Neg Hx    Stomach cancer Neg Hx      Review of Systems  Constitutional: Negative.  Negative for chills and fever.  HENT: Negative.  Negative for congestion and sore throat.   Respiratory: Negative.  Negative for cough and shortness of breath.   Cardiovascular: Negative.  Negative for chest pain and palpitations.  Gastrointestinal:  Negative for abdominal pain, diarrhea, nausea and  vomiting.  Genitourinary: Negative.   Skin: Negative.  Negative for rash.  Neurological: Negative.  Negative for dizziness and headaches.  All other systems reviewed and are negative.   Vitals:   11/28/22 1413  BP: 108/62  Pulse: 75  Temp: 97.8 F (36.6 C)  SpO2: 97%    Physical Exam Vitals reviewed.  Constitutional:      Appearance: Normal appearance.  HENT:     Head: Normocephalic.  Eyes:     Extraocular Movements: Extraocular movements intact.     Pupils:  Pupils are equal, round, and reactive to light.  Cardiovascular:     Rate and Rhythm: Normal rate and regular rhythm.  Pulmonary:     Effort: Pulmonary effort is normal.     Breath sounds: Normal breath sounds.  Musculoskeletal:     Cervical back: No tenderness.  Skin:    General: Skin is warm and dry.  Neurological:     General: No focal deficit present.     Mental Status: He is alert and oriented to person, place, and time.  Psychiatric:        Mood and Affect: Mood normal.        Behavior: Behavior normal.      ASSESSMENT & PLAN: A total of 35 minutes was spent with the patient and counseling/coordination of care regarding preparing for this visit, review of most recent office visit notes, review of most recent emergency department visit notes, review of most recent blood work results, education on nutrition, cardiovascular risk associated with prediabetes, prognosis, documentation, need for follow-up.  Problem List Items Addressed This Visit       Digestive   Gastroenteritis    Acute onset and spontaneously resolved. Asymptomatic today.  Back to normal.        Other   Pre-diabetes - Primary    Diet and nutrition discussed Cardiovascular risk associated with prediabetes discussed Referred to nutritionist      Relevant Orders   Amb ref to Medical Nutrition Therapy-MNT   Patient Instructions  Health Maintenance After Age 81 After age 34, you are at a higher risk for certain long-term diseases  and infections as well as injuries from falls. Falls are a major cause of broken bones and head injuries in people who are older than age 65. Getting regular preventive care can help to keep you healthy and well. Preventive care includes getting regular testing and making lifestyle changes as recommended by your health care provider. Talk with your health care provider about: Which screenings and tests you should have. A screening is a test that checks for a disease when you have no symptoms. A diet and exercise plan that is right for you. What should I know about screenings and tests to prevent falls? Screening and testing are the best ways to find a health problem early. Early diagnosis and treatment give you the best chance of managing medical conditions that are common after age 9. Certain conditions and lifestyle choices may make you more likely to have a fall. Your health care provider may recommend: Regular vision checks. Poor vision and conditions such as cataracts can make you more likely to have a fall. If you wear glasses, make sure to get your prescription updated if your vision changes. Medicine review. Work with your health care provider to regularly review all of the medicines you are taking, including over-the-counter medicines. Ask your health care provider about any side effects that may make you more likely to have a fall. Tell your health care provider if any medicines that you take make you feel dizzy or sleepy. Strength and balance checks. Your health care provider may recommend certain tests to check your strength and balance while standing, walking, or changing positions. Foot health exam. Foot pain and numbness, as well as not wearing proper footwear, can make you more likely to have a fall. Screenings, including: Osteoporosis screening. Osteoporosis is a condition that causes the bones to get weaker and break more easily. Blood pressure screening. Blood pressure changes and  medicines to  control blood pressure can make you feel dizzy. Depression screening. You may be more likely to have a fall if you have a fear of falling, feel depressed, or feel unable to do activities that you used to do. Alcohol use screening. Using too much alcohol can affect your balance and may make you more likely to have a fall. Follow these instructions at home: Lifestyle Do not drink alcohol if: Your health care provider tells you not to drink. If you drink alcohol: Limit how much you have to: 0-1 drink a day for women. 0-2 drinks a day for men. Know how much alcohol is in your drink. In the [place] one drink equals one 12 oz bottle of beer (355 mL), one 5 oz glass of wine (148 mL), or one 1 oz glass of hard liquor (44 mL). Do not use any products that contain nicotine or tobacco. These products include cigarettes, chewing tobacco, and vaping devices, such as e-cigarettes. If you need help quitting, ask your health care provider. Activity  Follow a regular exercise program to stay fit. This will help you maintain your balance. Ask your health care provider what types of exercise are appropriate for you. If you need a cane or walker, use it as recommended by your health care provider. Wear supportive shoes that have nonskid soles. Safety  Remove any tripping hazards, such as rugs, cords, and clutter. Install safety equipment such as grab bars in bathrooms and safety rails on stairs. Keep rooms and walkways well-lit. General instructions Talk with your health care provider about your risks for falling. Tell your health care provider if: You fall. Be sure to tell your health care provider about all falls, even ones that seem minor. You feel dizzy, tiredness (fatigue), or off-balance. Take over-the-counter and prescription medicines only as told by your health care provider. These include supplements. Eat a healthy diet and maintain a healthy weight. A healthy diet includes low-fat  dairy products, low-fat (lean) meats, and fiber from whole grains, beans, and lots of fruits and vegetables. Stay current with your vaccines. Schedule regular health, dental, and eye exams. Summary Having a healthy lifestyle and getting preventive care can help to protect your health and wellness after age 55. Screening and testing are the best way to find a health problem early and help you avoid having a fall. Early diagnosis and treatment give you the best chance for managing medical conditions that are more common for people who are older than age 27. Falls are a major cause of broken bones and head injuries in people who are older than age 34. Take precautions to prevent a fall at home. Work with your health care provider to learn what changes you can make to improve your health and wellness and to prevent falls. This information is not intended to replace advice given to you by your health care provider. Make sure you discuss any questions you have with your health care provider. Document Revised: 01/18/2021 Document Reviewed: 01/18/2021 Elsevier Patient Education  [place], MD [facility]

## 2022-11-28 NOTE — Assessment & Plan Note (Signed)
Acute onset and spontaneously resolved. Asymptomatic today.  Back to normal.

## 2022-11-28 NOTE — Patient Instructions (Signed)
Health Maintenance After Age 72 After age 72, you are at a higher risk for certain long-term diseases and infections as well as injuries from falls. Falls are a major cause of broken bones and head injuries in people who are older than age 72. Getting regular preventive care can help to keep you healthy and well. Preventive care includes getting regular testing and making lifestyle changes as recommended by your health care provider. Talk with your health care provider about: Which screenings and tests you should have. A screening is a test that checks for a disease when you have no symptoms. A diet and exercise plan that is right for you. What should I know about screenings and tests to prevent falls? Screening and testing are the best ways to find a health problem early. Early diagnosis and treatment give you the best chance of managing medical conditions that are common after age 72. Certain conditions and lifestyle choices may make you more likely to have a fall. Your health care provider may recommend: Regular vision checks. Poor vision and conditions such as cataracts can make you more likely to have a fall. If you wear glasses, make sure to get your prescription updated if your vision changes. Medicine review. Work with your health care provider to regularly review all of the medicines you are taking, including over-the-counter medicines. Ask your health care provider about any side effects that may make you more likely to have a fall. Tell your health care provider if any medicines that you take make you feel dizzy or sleepy. Strength and balance checks. Your health care provider may recommend certain tests to check your strength and balance while standing, walking, or changing positions. Foot health exam. Foot pain and numbness, as well as not wearing proper footwear, can make you more likely to have a fall. Screenings, including: Osteoporosis screening. Osteoporosis is a condition that causes  the bones to get weaker and break more easily. Blood pressure screening. Blood pressure changes and medicines to control blood pressure can make you feel dizzy. Depression screening. You may be more likely to have a fall if you have a fear of falling, feel depressed, or feel unable to do activities that you used to do. Alcohol use screening. Using too much alcohol can affect your balance and may make you more likely to have a fall. Follow these instructions at home: Lifestyle Do not drink alcohol if: Your health care provider tells you not to drink. If you drink alcohol: Limit how much you have to: 0-1 drink a day for women. 0-2 drinks a day for men. Know how much alcohol is in your drink. In the [place] one drink equals one 12 oz bottle of beer (355 mL), one 5 oz glass of wine (148 mL), or one 1 oz glass of hard liquor (44 mL). Do not use any products that contain nicotine or tobacco. These products include cigarettes, chewing tobacco, and vaping devices, such as e-cigarettes. If you need help quitting, ask your health care provider. Activity  Follow a regular exercise program to stay fit. This will help you maintain your balance. Ask your health care provider what types of exercise are appropriate for you. If you need a cane or walker, use it as recommended by your health care provider. Wear supportive shoes that have nonskid soles. Safety  Remove any tripping hazards, such as rugs, cords, and clutter. Install safety equipment such as grab bars in bathrooms and safety rails on stairs. Keep rooms and walkways   well-lit. General instructions Talk with your health care provider about your risks for falling. Tell your health care provider if: You fall. Be sure to tell your health care provider about all falls, even ones that seem minor. You feel dizzy, tiredness (fatigue), or off-balance. Take over-the-counter and prescription medicines only as told by your health care provider. These include  supplements. Eat a healthy diet and maintain a healthy weight. A healthy diet includes low-fat dairy products, low-fat (lean) meats, and fiber from whole grains, beans, and lots of fruits and vegetables. Stay current with your vaccines. Schedule regular health, dental, and eye exams. Summary Having a healthy lifestyle and getting preventive care can help to protect your health and wellness after age 72. Screening and testing are the best way to find a health problem early and help you avoid having a fall. Early diagnosis and treatment give you the best chance for managing medical conditions that are more common for people who are older than age 72. Falls are a major cause of broken bones and head injuries in people who are older than age 72. Take precautions to prevent a fall at home. Work with your health care provider to learn what changes you can make to improve your health and wellness and to prevent falls. This information is not intended to replace advice given to you by your health care provider. Make sure you discuss any questions you have with your health care provider. Document Revised: 01/18/2021 Document Reviewed: 01/18/2021 Elsevier Patient Education  2023 Elsevier Inc.  

## 2022-11-28 NOTE — Assessment & Plan Note (Signed)
Diet and nutrition discussed Cardiovascular risk associated with prediabetes discussed Referred to nutritionist

## 2022-11-29 ENCOUNTER — Encounter: Payer: Self-pay | Admitting: Emergency Medicine

## 2022-11-29 NOTE — Telephone Encounter (Signed)
Please look into this. Thanks.

## 2022-11-30 ENCOUNTER — Telehealth: Payer: Self-pay

## 2022-11-30 NOTE — Telephone Encounter (Signed)
     Patient  visit on 11/27/2022  at [facility] was for emesis.  Have you been able to follow up with your primary care physician? Yes  The patient was or was not able to obtain any needed medicine or equipment. Patient was able to obtain medication.  Are there diet recommendations that you are having difficulty following? No  Patient expresses understanding of discharge instructions and education provided has no other needs at this time. Yes   [place] [facility] Care Guide   [email or web address]  ?? [id number]   Website: [email or web address]  [email or web address]

## 2022-12-08 ENCOUNTER — Telehealth: Payer: Self-pay | Admitting: Emergency Medicine

## 2022-12-08 NOTE — Telephone Encounter (Signed)
Izora Gala calls today from [facility] in regards to a recent referral they had received. PT's insurance will not cover them being seen at that facility without them having diabetes or chronic kidney disease (pre-diabetes would not count). If you have any questions you can reach back out to [place] at  [facility]: [phone]

## 2022-12-12 NOTE — Telephone Encounter (Signed)
A new referral was placed on 11/28/2022 to [facility] , please see referral note

## 2023-03-14 ENCOUNTER — Encounter: Payer: Self-pay | Admitting: Gastroenterology

## 2023-03-22 ENCOUNTER — Encounter: Payer: Self-pay | Admitting: Emergency Medicine

## 2023-03-22 ENCOUNTER — Ambulatory Visit (INDEPENDENT_AMBULATORY_CARE_PROVIDER_SITE_OTHER): Payer: PPO | Admitting: Emergency Medicine

## 2023-03-22 VITALS — BP 106/64 | HR 64 | Temp 98.1°F | Ht 70.0 in | Wt 139.2 lb

## 2023-03-22 DIAGNOSIS — Z13220 Encounter for screening for lipoid disorders: Secondary | ICD-10-CM | POA: Diagnosis not present

## 2023-03-22 DIAGNOSIS — Z1329 Encounter for screening for other suspected endocrine disorder: Secondary | ICD-10-CM

## 2023-03-22 DIAGNOSIS — Z0000 Encounter for general adult medical examination without abnormal findings: Secondary | ICD-10-CM | POA: Diagnosis not present

## 2023-03-22 DIAGNOSIS — Z125 Encounter for screening for malignant neoplasm of prostate: Secondary | ICD-10-CM | POA: Diagnosis not present

## 2023-03-22 DIAGNOSIS — Z13228 Encounter for screening for other metabolic disorders: Secondary | ICD-10-CM | POA: Diagnosis not present

## 2023-03-22 DIAGNOSIS — Z130 Encounter for screening for diseases of the blood and blood-forming organs and certain disorders involving the immune mechanism: Secondary | ICD-10-CM

## 2023-03-22 LAB — CBC WITH DIFFERENTIAL/PLATELET
Basophils Absolute: 0.1 10*3/uL (ref 0.0–0.1)
Basophils Relative: 1.3 % (ref 0.0–3.0)
Eosinophils Absolute: 0.3 10*3/uL (ref 0.0–0.7)
Eosinophils Relative: 6.5 % — ABNORMAL HIGH (ref 0.0–5.0)
HCT: 47 % (ref 39.0–52.0)
Hemoglobin: 15.5 g/dL (ref 13.0–17.0)
Lymphocytes Relative: 22.8 % (ref 12.0–46.0)
Lymphs Abs: 1 10*3/uL (ref 0.7–4.0)
MCHC: 32.9 g/dL (ref 30.0–36.0)
MCV: 92.6 fl (ref 78.0–100.0)
Monocytes Absolute: 0.4 10*3/uL (ref 0.1–1.0)
Monocytes Relative: 8.8 % (ref 3.0–12.0)
Neutro Abs: 2.7 10*3/uL (ref 1.4–7.7)
Neutrophils Relative %: 60.6 % (ref 43.0–77.0)
Platelets: 157 10*3/uL (ref 150.0–400.0)
RBC: 5.08 Mil/uL (ref 4.22–5.81)
RDW: 14 % (ref 11.5–15.5)
WBC: 4.5 10*3/uL (ref 4.0–10.5)

## 2023-03-22 LAB — COMPREHENSIVE METABOLIC PANEL
ALT: 19 U/L (ref 0–53)
AST: 24 U/L (ref 0–37)
Albumin: 4.3 g/dL (ref 3.5–5.2)
Alkaline Phosphatase: 86 U/L (ref 39–117)
BUN: 29 mg/dL — ABNORMAL HIGH (ref 6–23)
CO2: 28 mEq/L (ref 19–32)
Calcium: 9.8 mg/dL (ref 8.4–10.5)
Chloride: 105 mEq/L (ref 96–112)
Creatinine, Ser: 0.98 mg/dL (ref 0.40–1.50)
GFR: 77.29 mL/min (ref >60.00)
Glucose, Bld: 89 mg/dL (ref 70–99)
Potassium: 4.8 mEq/L (ref 3.5–5.1)
Sodium: 141 mEq/L (ref 135–145)
Total Bilirubin: 0.8 mg/dL (ref 0.2–1.2)
Total Protein: 6.2 g/dL (ref 6.0–8.3)

## 2023-03-22 LAB — PSA: PSA: 2.64 ng/mL (ref 0.10–4.00)

## 2023-03-22 LAB — HEMOGLOBIN A1C: Hgb A1c MFr Bld: 5.9 % (ref 4.6–6.5)

## 2023-03-22 LAB — LIPID PANEL
Cholesterol: 147 mg/dL (ref 0–200)
HDL: 49.9 mg/dL (ref >39.00)
LDL Cholesterol: 77 mg/dL (ref 0–99)
NonHDL: 96.63
Total CHOL/HDL Ratio: 3
Triglycerides: 97 mg/dL (ref 0.0–149.0)
VLDL: 19.4 mg/dL (ref 0.0–40.0)

## 2023-03-22 NOTE — Progress Notes (Signed)
Cory Thomas 72 y.o.   Chief Complaint  Patient presents with   Annual Exam    Patient states he wants to get some labs done today, NO concerns     HISTORY OF PRESENT ILLNESS: This is a 72 y.o. male A1A here for annual exam. Overall doing much better. Healthy male with healthy lifestyle Has no complaints or medical concerns today.  HPI   Prior to Admission medications   Medication Sig Start Date End Date Taking? Authorizing Provider  Alpha-D-Galactosidase Charlyne Quale) TABS Take 2 tablets by mouth daily. 800 Galu   Yes [provider]  calcium carbonate (OS-CAL - DOSED IN MG OF ELEMENTAL CALCIUM) 1250 (500 Ca) MG tablet Take 1 tablet by mouth.   Yes [provider]  Cholecalciferol (VITAMIN D3) 50 MCG (2000 UT) TABS Take 6,000 Units by mouth daily.    [provider]  zinc gluconate 50 MG tablet Take 25 mg by mouth daily.    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     Past Medical History:  Diagnosis Date   Allergy    Anxiety    per pt after father died   Asthma    age 73   Cancer (HCC)    skin on arms and legs years ago rmoved over 10 yrs ago   Gastritis    years ago   Osteoporosis    Refusal of blood product     Past Surgical History:  Procedure Laterality Date   CLOSED REDUCTION WRIST FRACTURE     72 years old   ESOPHAGOGASTRODUODENOSCOPY N/A 03/05/2020   Procedure: ESOPHAGOGASTRODUODENOSCOPY (EGD);   Surgeon: Rachael Fee, MD;  Location: [facility];  Service: Endoscopy;  Laterality: N/A;   EUS N/A 03/05/2020   Procedure: UPPER ENDOSCOPIC ULTRASOUND (EUS) LINEAR;  Surgeon: Rachael Fee, MD;  Location: [facility];  Service: Endoscopy;  Laterality: N/A;   FINE NEEDLE ASPIRATION N/A 03/05/2020   Procedure: FINE NEEDLE ASPIRATION (FNA) LINEAR;  Surgeon: Rachael Fee, MD;  Location: [facility];  Service: Endoscopy;  Laterality: N/A;   POLYPECTOMY  12/2018   colon   TONSILLECTOMY     WISDOM TOOTH EXTRACTION      Social History   Socioeconomic History   Marital status: Married    Spouse name: Pat   Number of children: Not on file   Years of education: Not on file   Highest education level: Not on file  Occupational History   Occupation: Health visitor  Tobacco Use   Smoking status: Never   Smokeless tobacco: Never  Vaping Use   Vaping Use: Never used  Substance and Sexual Activity   Alcohol use: Yes    Alcohol/week: 3.0 standard drinks of alcohol    Types: 3 Glasses of wine per week    Comment: 1-2 bottles of beer/week, red wine   Drug use: Never   Sexual activity: Not on file  Other Topics Concern  Not on file  Social History Narrative   Jehovah's Witness   From [place]   Likes to hike   Social Determinants of Health   Financial Resource Strain: Low Risk  (07/26/2022)   Overall Financial Resource Strain (CARDIA)    Difficulty of Paying Living Expenses: Not hard at all  Food Insecurity: No Food Insecurity (07/26/2022)   Hunger Vital Sign    Worried About Running Out of Food in the Last Year: Never true    Ran Out of Food in the Last Year: Never true  Transportation Needs: No Transportation Needs (07/26/2022)   PRAPARE - Administrator, Civil Service (Medical): No    Lack of Transportation (Non-Medical): No  Physical Activity: Sufficiently Active (07/26/2022)   Exercise Vital Sign    Days of Exercise per Week: 7 days    Minutes of  Exercise per Session: 150+ min  Stress: No Stress Concern Present (07/26/2022)   [employer] of Occupational Health - Occupational Stress Questionnaire    Feeling of Stress : Not at all  Social Connections: Socially Integrated (07/26/2022)   Social Connection and Isolation Panel [NHANES]    Frequency of Communication with Friends and Family: More than three times a week    Frequency of Social Gatherings with Friends and Family: More than three times a week    Attends Religious Services: More than 4 times per year    Active Member of [employer] or Organizations: Yes    Attends Engineer, structural: More than 4 times per year    Marital Status: Married  Catering manager Violence: Not At Risk (07/26/2022)   Humiliation, Afraid, Rape, and Kick questionnaire    Fear of Current or Ex-Partner: No    Emotionally Abused: No    Physically Abused: No    Sexually Abused: No    Family History  Problem Relation Age of Onset   Dementia Father    Colon cancer Neg Hx    Colon polyps Neg Hx    Esophageal cancer Neg Hx    Rectal cancer Neg Hx    Stomach cancer Neg Hx      Review of Systems  Constitutional: Negative.  Negative for chills and fever.  HENT: Negative.  Negative for congestion and sore throat.   Respiratory: Negative.  Negative for cough and shortness of breath.   Cardiovascular: Negative.  Negative for chest pain and palpitations.  Gastrointestinal:  Negative for abdominal pain, diarrhea, nausea and vomiting.  Genitourinary: Negative.  Negative for dysuria and hematuria.  Skin: Negative.  Negative for rash.  Neurological: Negative.  Negative for dizziness and headaches.  All other systems reviewed and are negative.   Today's Vitals   03/22/23 0945  BP: 106/64  Pulse: 64  Temp: 98.1 F (36.7 C)  TempSrc: Oral  SpO2: 98%  Weight: 139 lb 4 oz (63.2 kg)  Height: 5\' 10"  (1.778 m)   Body mass index is 19.98 kg/m.   Physical Exam HENT:     Head: Normocephalic.      Right Ear: Tympanic membrane, ear canal and external ear normal.     Left Ear: Tympanic membrane, ear canal and external ear normal.     Mouth/Throat:     Mouth: Mucous membranes are moist.     Pharynx: Oropharynx is clear.  Eyes:     Extraocular Movements: Extraocular movements intact.     Conjunctiva/sclera: Conjunctivae normal.     Pupils: Pupils are equal, round, and reactive to light.  Cardiovascular:     Rate and Rhythm: Normal rate and regular rhythm.     Pulses: Normal pulses.     Heart sounds: Normal heart sounds.  Pulmonary:     Effort: Pulmonary effort is normal.     Breath sounds: Normal breath sounds.  Abdominal:     Palpations: Abdomen is soft.     Tenderness: There is no abdominal tenderness.  Musculoskeletal:     Cervical back: No tenderness.     Right lower leg: No edema.     Left lower leg: No edema.  Lymphadenopathy:     Cervical: No cervical adenopathy.  Skin:    General: Skin is warm and dry.     Capillary Refill: Capillary refill takes less than 2 seconds.  Neurological:     Mental Status: He is alert and oriented to person, place, and time.  Psychiatric:        Mood and Affect: Mood normal.        Behavior: Behavior normal.      ASSESSMENT & PLAN: Problem List Items Addressed This Visit   None Visit Diagnoses     Routine general medical examination at a health care facility    -  Primary   Relevant Orders   CBC with Differential   Comprehensive metabolic panel   Hemoglobin A1c   Lipid panel   PSA(Must document that pt has been informed of limitations of PSA testing.)   Screening for prostate cancer       Relevant Orders   PSA(Must document that pt has been informed of limitations of PSA testing.)   Screening for deficiency anemia       Relevant Orders   CBC with Differential   Screening for lipoid disorders       Relevant Orders   Lipid panel   Lipoprotein A (LPA)   Screening for endocrine, metabolic and immunity disorder        Relevant Orders   Comprehensive metabolic panel   Hemoglobin A1c      Modifiable risk factors discussed with patient. Anticipatory guidance according to age provided. The following topics were also discussed: Social Determinants of Health Smoking.  Non-smoker Diet and nutrition.  Good eating habits Benefits of exercise.  Exercises regularly Cancer screening and review of most recent colonoscopy report from 2019 Vaccinations reviewed and recommendations Cardiovascular risk assessment and need for blood work The 10-year ASCVD risk score (Arnett DK, et al., 2019) is: 13.5%   Values used to calculate the score:     Age: 39 years     Sex: Male     Is Non-Hispanic African American: No     Diabetic: No     Tobacco smoker: No     Systolic Blood Pressure: 106 mmHg     Is BP treated: No     HDL Cholesterol: 56.3 mg/dL     Total Cholesterol: 168 mg/dL  Mental health including depression and anxiety Fall and accident prevention  Patient Instructions  Health Maintenance, Male Adopting a healthy lifestyle and getting preventive care are important in promoting health and wellness. Ask your health care provider about: The right schedule for you to have regular tests and exams. Things you can do on your own to prevent diseases and keep yourself healthy. What should I know about diet, weight, and exercise? Eat a healthy diet  Eat a diet that includes plenty of vegetables, fruits, low-fat dairy products, and lean protein. Do not eat a lot of foods that are  high in solid fats, added sugars, or sodium. Maintain a healthy weight Body mass index (BMI) is a measurement that can be used to identify possible weight problems. It estimates body fat based on height and weight. Your health care provider can help determine your BMI and help you achieve or maintain a healthy weight. Get regular exercise Get regular exercise. This is one of the most important things you can do for your health. Most adults  should: Exercise for at least 150 minutes each week. The exercise should increase your heart rate and make you sweat (moderate-intensity exercise). Do strengthening exercises at least twice a week. This is in addition to the moderate-intensity exercise. Spend less time sitting. Even light physical activity can be beneficial. Watch cholesterol and blood lipids Have your blood tested for lipids and cholesterol at 72 years of age, then have this test every 5 years. You may need to have your cholesterol levels checked more often if: Your lipid or cholesterol levels are high. You are older than 72 years of age. You are at high risk for heart disease. What should I know about cancer screening? Many types of cancers can be detected early and may often be prevented. Depending on your health history and family history, you may need to have cancer screening at various ages. This may include screening for: Colorectal cancer. Prostate cancer. Skin cancer. Lung cancer. What should I know about heart disease, diabetes, and high blood pressure? Blood pressure and heart disease High blood pressure causes heart disease and increases the risk of stroke. This is more likely to develop in people who have high blood pressure readings or are overweight. Talk with your health care provider about your target blood pressure readings. Have your blood pressure checked: Every 3-5 years if you are 53-50 years of age. Every year if you are 45 years old or older. If you are between the ages of 103 and 52 and are a current or former smoker, ask your health care provider if you should have a one-time screening for abdominal aortic aneurysm (AAA). Diabetes Have regular diabetes screenings. This checks your fasting blood sugar level. Have the screening done: Once every three years after age 67 if you are at a normal weight and have a low risk for diabetes. More often and at a younger age if you are overweight or have a high  risk for diabetes. What should I know about preventing infection? Hepatitis B If you have a higher risk for hepatitis B, you should be screened for this virus. Talk with your health care provider to find out if you are at risk for hepatitis B infection. Hepatitis C Blood testing is recommended for: Everyone born from [birth year] through 1965. Anyone with known risk factors for hepatitis C. Sexually transmitted infections (STIs) You should be screened each year for STIs, including gonorrhea and chlamydia, if: You are sexually active and are younger than 72 years of age. You are older than 72 years of age and your health care provider tells you that you are at risk for this type of infection. Your sexual activity has changed since you were last screened, and you are at increased risk for chlamydia or gonorrhea. Ask your health care provider if you are at risk. Ask your health care provider about whether you are at high risk for HIV. Your health care provider may recommend a prescription medicine to help prevent HIV infection. If you choose to take medicine to prevent HIV, you should first get  tested for HIV. You should then be tested every 3 months for as long as you are taking the medicine. Follow these instructions at home: Alcohol use Do not drink alcohol if your health care provider tells you not to drink. If you drink alcohol: Limit how much you have to 0-2 drinks a day. Know how much alcohol is in your drink. In the [place] one drink equals one 12 oz bottle of beer (355 mL), one 5 oz glass of wine (148 mL), or one 1 oz glass of hard liquor (44 mL). Lifestyle Do not use any products that contain nicotine or tobacco. These products include cigarettes, chewing tobacco, and vaping devices, such as e-cigarettes. If you need help quitting, ask your health care provider. Do not use street drugs. Do not share needles. Ask your health care provider for help if you need support or information about  quitting drugs. General instructions Schedule regular health, dental, and eye exams. Stay current with your vaccines. Tell your health care provider if: You often feel depressed. You have ever been abused or do not feel safe at home. Summary Adopting a healthy lifestyle and getting preventive care are important in promoting health and wellness. Follow your health care provider's instructions about healthy diet, exercising, and getting tested or screened for diseases. Follow your health care provider's instructions on monitoring your cholesterol and blood pressure. This information is not intended to replace advice given to you by your health care provider. Make sure you discuss any questions you have with your health care provider. Document Revised: 01/18/2021 Document Reviewed: 01/18/2021 Elsevier Patient Education  2024 Elsevier Inc.    Edwina Barth, MD [facility]

## 2023-03-22 NOTE — Patient Instructions (Signed)
Health Maintenance, Male Adopting a healthy lifestyle and getting preventive care are important in promoting health and wellness. Ask your health care provider about: The right schedule for you to have regular tests and exams. Things you can do on your own to prevent diseases and keep yourself healthy. What should I know about diet, weight, and exercise? Eat a healthy diet  Eat a diet that includes plenty of vegetables, fruits, low-fat dairy products, and lean protein. Do not eat a lot of foods that are high in solid fats, added sugars, or sodium. Maintain a healthy weight Body mass index (BMI) is a measurement that can be used to identify possible weight problems. It estimates body fat based on height and weight. Your health care provider can help determine your BMI and help you achieve or maintain a healthy weight. Get regular exercise Get regular exercise. This is one of the most important things you can do for your health. Most adults should: Exercise for at least 150 minutes each week. The exercise should increase your heart rate and make you sweat (moderate-intensity exercise). Do strengthening exercises at least twice a week. This is in addition to the moderate-intensity exercise. Spend less time sitting. Even light physical activity can be beneficial. Watch cholesterol and blood lipids Have your blood tested for lipids and cholesterol at 72 years of age, then have this test every 5 years. You may need to have your cholesterol levels checked more often if: Your lipid or cholesterol levels are high. You are older than 72 years of age. You are at high risk for heart disease. What should I know about cancer screening? Many types of cancers can be detected early and may often be prevented. Depending on your health history and family history, you may need to have cancer screening at various ages. This may include screening for: Colorectal cancer. Prostate cancer. Skin cancer. Lung  cancer. What should I know about heart disease, diabetes, and high blood pressure? Blood pressure and heart disease High blood pressure causes heart disease and increases the risk of stroke. This is more likely to develop in people who have high blood pressure readings or are overweight. Talk with your health care provider about your target blood pressure readings. Have your blood pressure checked: Every 3-5 years if you are 18-39 years of age. Every year if you are 40 years old or older. If you are between the ages of 65 and 75 and are a current or former smoker, ask your health care provider if you should have a one-time screening for abdominal aortic aneurysm (AAA). Diabetes Have regular diabetes screenings. This checks your fasting blood sugar level. Have the screening done: Once every three years after age 45 if you are at a normal weight and have a low risk for diabetes. More often and at a younger age if you are overweight or have a high risk for diabetes. What should I know about preventing infection? Hepatitis B If you have a higher risk for hepatitis B, you should be screened for this virus. Talk with your health care provider to find out if you are at risk for hepatitis B infection. Hepatitis C Blood testing is recommended for: Everyone born from 1945 through 1965. Anyone with known risk factors for hepatitis C. Sexually transmitted infections (STIs) You should be screened each year for STIs, including gonorrhea and chlamydia, if: You are sexually active and are younger than 72 years of age. You are older than 72 years of age and your   health care provider tells you that you are at risk for this type of infection. Your sexual activity has changed since you were last screened, and you are at increased risk for chlamydia or gonorrhea. Ask your health care provider if you are at risk. Ask your health care provider about whether you are at high risk for HIV. Your health care provider  may recommend a prescription medicine to help prevent HIV infection. If you choose to take medicine to prevent HIV, you should first get tested for HIV. You should then be tested every 3 months for as long as you are taking the medicine. Follow these instructions at home: Alcohol use Do not drink alcohol if your health care provider tells you not to drink. If you drink alcohol: Limit how much you have to 0-2 drinks a day. Know how much alcohol is in your drink. In the [place] one drink equals one 12 oz bottle of beer (355 mL), one 5 oz glass of wine (148 mL), or one 1 oz glass of hard liquor (44 mL). Lifestyle Do not use any products that contain nicotine or tobacco. These products include cigarettes, chewing tobacco, and vaping devices, such as e-cigarettes. If you need help quitting, ask your health care provider. Do not use street drugs. Do not share needles. Ask your health care provider for help if you need support or information about quitting drugs. General instructions Schedule regular health, dental, and eye exams. Stay current with your vaccines. Tell your health care provider if: You often feel depressed. You have ever been abused or do not feel safe at home. Summary Adopting a healthy lifestyle and getting preventive care are important in promoting health and wellness. Follow your health care provider's instructions about healthy diet, exercising, and getting tested or screened for diseases. Follow your health care provider's instructions on monitoring your cholesterol and blood pressure. This information is not intended to replace advice given to you by your health care provider. Make sure you discuss any questions you have with your health care provider. Document Revised: 01/18/2021 Document Reviewed: 01/18/2021 Elsevier Patient Education  2024 Elsevier Inc.  

## 2023-03-23 LAB — LIPOPROTEIN A (LPA): Lipoprotein (a): 27 nmol/L (ref <75)

## 2023-04-10 ENCOUNTER — Ambulatory Visit (INDEPENDENT_AMBULATORY_CARE_PROVIDER_SITE_OTHER): Payer: PPO

## 2023-04-10 VITALS — BP 110/80 | Ht 70.0 in | Wt 141.0 lb

## 2023-04-10 DIAGNOSIS — Z0000 Encounter for general adult medical examination without abnormal findings: Secondary | ICD-10-CM

## 2023-04-10 NOTE — Progress Notes (Signed)
Subjective:   Cory Thomas is a 72 y.o. male who presents for Medicare Annual/Subsequent preventive examination.  Visit Complete: In person  Patient Medicare AWV questionnaire was completed by the patient on 04/10/2023; I have confirmed that all information answered by patient is correct and no changes since this date.  Review of Systems    Cardiac Risk Factors include: advanced age (>56men, >57 women);male gender     Objective:    Today's Vitals   04/10/23 1335  BP: 110/80  Weight: 141 lb (64 kg)  Height: 5\' 10"  (1.778 m)   Body mass index is 20.23 kg/m.     04/10/2023    1:50 PM 11/27/2022   12:47 PM 07/26/2022    2:38 PM 07/14/2021    1:24 PM 06/24/2020    7:23 AM 06/16/2020    1:49 PM 05/04/2020   11:10 AM  Advanced Directives  Does Patient Have a Medical Advance Directive? Yes No Yes Yes Yes Yes Yes  Type of Estate agent of [place];Living will  Healthcare Power of [place];Living will  Healthcare Power of [place];Living will  Healthcare Power of [place];Living will  Does patient want to make changes to medical advance directive?   No - Patient declined No - Patient declined No - Patient declined Yes (MAU/Ambulatory/Procedural Areas - Information given)   Copy of Healthcare Power of Attorney in Chart? No - copy requested  Yes - validated most recent copy scanned in chart (See row information)  Yes - validated most recent copy scanned in chart (See row information)  Yes - validated most recent copy scanned in chart (See row information)  Would patient like information on creating a medical advance directive?  No - Patient declined         Current Medications (verified) Outpatient Encounter Medications as of 04/10/2023  Medication Sig   calcium carbonate (OS-CAL - DOSED IN MG OF ELEMENTAL CALCIUM) 1250 (500 Ca) MG tablet Take 1 tablet by mouth.   Cholecalciferol (VITAMIN D3) 50 MCG (2000 UT) TABS Take 6,000 Units by mouth daily.   zinc  gluconate 50 MG tablet Take 25 mg by mouth daily.   Alpha-D-Galactosidase (BEANO) TABS Take 2 tablets by mouth daily. 800 Galu   No facility-administered encounter medications on file as of 04/10/2023.    Allergies (verified) Other   History: Past Medical History:  Diagnosis Date   Allergy    Anxiety    per pt after father died   Asthma    age 62   Cancer (HCC)    skin on arms and legs years ago rmoved over 10 yrs ago   Gastritis    years ago   Osteoporosis    Refusal of blood product    Past Surgical History:  Procedure Laterality Date   Cataract surgery Bilateral 2023   CLOSED REDUCTION WRIST FRACTURE     72 years old   ESOPHAGOGASTRODUODENOSCOPY N/A 03/05/2020   Procedure: ESOPHAGOGASTRODUODENOSCOPY (EGD);  Surgeon: Rachael Fee, MD;  Location: [facility];  Service: Endoscopy;  Laterality: N/A;   EUS N/A 03/05/2020   Procedure: UPPER ENDOSCOPIC ULTRASOUND (EUS) LINEAR;  Surgeon: Rachael Fee, MD;  Location: [facility];  Service: Endoscopy;  Laterality: N/A;   FINE NEEDLE ASPIRATION N/A 03/05/2020   Procedure: FINE NEEDLE ASPIRATION (FNA) LINEAR;  Surgeon: Rachael Fee, MD;  Location: [facility];  Service: Endoscopy;  Laterality: N/A;   POLYPECTOMY  12/2018   colon   TONSILLECTOMY     WISDOM TOOTH  EXTRACTION     Family History  Problem Relation Age of Onset   Dementia Father    Colon cancer Neg Hx    Colon polyps Neg Hx    Esophageal cancer Neg Hx    Rectal cancer Neg Hx    Stomach cancer Neg Hx    Social History   Socioeconomic History   Marital status: Married    Spouse name: Garment/textile technologist   Number of children: Not on file   Years of education: Not on file   Highest education level: Not on file  Occupational History   Occupation: Radiographer, therapeutic Retired  Tobacco Use   Smoking status: Never   Smokeless tobacco: Never  Vaping Use   Vaping status: Never Used  Substance and Sexual Activity   Alcohol use: Yes    Alcohol/week: 3.0 standard  drinks of alcohol    Types: 3 Glasses of wine per week    Comment: 1-2 bottles of beer/week, red wine   Drug use: Never   Sexual activity: Not on file  Other Topics Concern   Not on file  Social History Narrative   Jehovah's Witness   From [place]   Likes to hike   Social Determinants of Health   Financial Resource Strain: Low Risk  (04/10/2023)   Overall Financial Resource Strain (CARDIA)    Difficulty of Paying Living Expenses: Not hard at all  Food Insecurity: No Food Insecurity (04/10/2023)   Hunger Vital Sign    Worried About Running Out of Food in the Last Year: Never true    Ran Out of Food in the Last Year: Never true  Transportation Needs: No Transportation Needs (04/10/2023)   PRAPARE - Administrator, Civil Service (Medical): No    Lack of Transportation (Non-Medical): No  Physical Activity: Sufficiently Active (04/10/2023)   Exercise Vital Sign    Days of Exercise per Week: 7 days    Minutes of Exercise per Session: 50 min  Stress: No Stress Concern Present (04/10/2023)   [employer] of Occupational Health - Occupational Stress Questionnaire    Feeling of Stress : Not at all  Social Connections: Socially Integrated (04/10/2023)   Social Connection and Isolation Panel [NHANES]    Frequency of Communication with Friends and Family: More than three times a week    Frequency of Social Gatherings with Friends and Family: Twice a week    Attends Religious Services: More than 4 times per year    Active Member of [employer] or Organizations: Yes    Attends Engineer, structural: More than 4 times per year    Marital Status: Married    Tobacco Counseling Counseling given: Not Answered   Clinical Intake:  Pre-visit preparation completed: Yes  Pain : No/denies pain     BMI - recorded: 20.23 Diabetes: No  How often do you need to have someone help you when you read instructions, pamphlets, or other written materials from your doctor or pharmacy?:  1 - Never  Interpreter Needed?: No  Information entered by :: Trent Gabler, RMA   Activities of Daily Living    04/10/2023    1:43 PM 04/10/2023    7:16 AM  In your present state of health, do you have any difficulty performing the following activities:  Hearing? 0 0  Vision? 0 0  Difficulty concentrating or making decisions? 0 0  Walking or climbing stairs? 0 0  Dressing or bathing? 0 0  Doing errands, shopping? 0 0  Preparing Food and eating ? N N  Using the Toilet? N N  In the past six months, have you accidently leaked urine? N N  Do you have problems with loss of bowel control? N N  Managing your Medications?  N  Managing your Finances? N N  Housekeeping or managing your Housekeeping? N N    Patient Care Team: Georgina Quint, MD as PCP - General (Internal Medicine) Karie Soda, MD as Consulting Physician (General Surgery) Armbruster, Willaim Rayas, MD as Consulting Physician (Gastroenterology) Elwin Mocha, MD as Consulting Physician (Ophthalmology)  Indicate any recent Medical Services you may have received from other than [facility] providers in the past year (date may be approximate).     Assessment:   This is a routine wellness examination for [place].  Hearing/Vision screen Hearing Screening - Comments:: Denies hearing difficulties    Dietary issues and exercise activities discussed:     Goals Addressed             This Visit's Progress    Patient Stated   On track    Increase my water intake by being conscious of how much I am drinking.      Depression Screen    04/10/2023    2:21 PM 03/22/2023    9:46 AM 07/26/2022    2:16 PM 03/28/2022    1:14 PM 02/09/2022    8:00 AM 07/14/2021    1:23 PM 01/21/2021   10:45 AM  PHQ 2/9 Scores  PHQ - 2 Score 0 0 0 0 0 0 0  PHQ- 9 Score 0          Fall Risk    04/10/2023    1:50 PM 04/10/2023    7:16 AM 03/22/2023    9:46 AM 07/26/2022    2:15 PM 03/28/2022    1:14 PM  Fall Risk   Falls in  the past year? 0 0 0 0 1  Number falls in past yr: 0 0 0 0 0  Injury with Fall?  0 0 0 0  Risk for fall due to : No Fall Risks  No Fall Risks No Fall Risks   Follow up   Falls evaluation completed Falls evaluation completed     MEDICARE RISK AT HOME:   TIMED UP AND GO:  Was the test performed?  Yes  Length of time to ambulate 10 feet: 10 sec Gait steady and fast without use of assistive device    Cognitive Function:    07/26/2022    2:39 PM  MMSE - Mini Mental State Exam  Not completed: Refused        04/10/2023    1:51 PM 01/14/2019   10:13 AM  6CIT Screen  What Year? 0 points 0 points  What month? 0 points 0 points  What time? 0 points 0 points  Count back from 20 0 points 0 points  Months in reverse 0 points 0 points  Repeat phrase 0 points 0 points  Total Score 0 points 0 points    Immunizations Immunization History  Administered Date(s) Administered   Fluad Quad(high Dose 65+) 07/08/2019, 06/16/2021, 07/06/2022   Hepatitis A 10/06/1997   Hepatitis A, Adult 12/06/2017   Influenza, High Dose Seasonal PF 07/06/2017, 07/11/2018   Influenza-Unspecified 06/17/2015, 07/11/2018, 06/16/2021   PFIZER(Purple Top)SARS-COV-2 Vaccination 10/04/2019, 11/03/2019, 06/08/2020   Pfizer Covid-19 Vaccine Bivalent Booster 63yrs & up 05/20/2021   Pneumococcal Conjugate-13 09/10/2016   Td 10/06/1997   Tdap 06/17/2015   Typhoid  Inactivated 12/07/2017   Unspecified SARS-COV-2 Vaccination 07/06/2022   Zoster Recombinant(Shingrix) 04/23/2021, 09/17/2021    TDAP status: Up to date  Flu Vaccine status: Up to date  Pneumococcal vaccine status: Up to date  Covid-19 vaccine status: Completed vaccines  Qualifies for Shingles Vaccine? Yes   Zostavax completed Yes   Shingrix Completed?: Yes  Screening Tests Health Maintenance  Topic Date Due   COVID-19 Vaccine (6 - 2023-24 season) 08/31/2022   Colonoscopy  03/29/2024 (Originally 03/30/2023)   INFLUENZA VACCINE  04/13/2023    Medicare Annual Wellness (AWV)  04/09/2024   DTaP/Tdap/Td (3 - Td or Tdap) 06/16/2025   Pneumonia Vaccine 80+ Years old  Completed   Hepatitis C Screening  Completed   Zoster Vaccines- Shingrix  Completed   HPV VACCINES  Aged Out    Health Maintenance  Health Maintenance Due  Topic Date Due   COVID-19 Vaccine (6 - 2023-24 season) 08/31/2022    Colorectal cancer screening: Type of screening: Colonoscopy. Completed 03/29/2018. Repeat every 6 years  Lung Cancer Screening: (Low Dose CT Chest recommended if Age 49-80 years, 20 pack-year currently smoking OR have quit w/in 15years.) does not qualify.   Lung Cancer Screening Referral: N/A  Additional Screening:  Hepatitis C Screening: does qualify; Completed 01/09/2018  Vision Screening: Recommended annual ophthalmology exams for early detection of glaucoma and other disorders of the eye. Is the patient up to date with their annual eye exam?  Yes  Who is the provider or what is the name of the office in which the patient attends annual eye exams? Patient is thinking about going to see Fredrich Birks If pt is not established with a provider, would they like to be referred to a provider to establish care? No .   Dental Screening: Recommended annual dental exams for proper oral hygiene  Community Resource Referral / Chronic Care Management: CRR required this visit?  No   CCM required this visit?  No     Plan:     I have personally reviewed and noted the following in the patient's chart:   Medical and social history Use of alcohol, tobacco or illicit drugs  Current medications and supplements including opioid prescriptions. Patient is not currently taking opioid prescriptions. Functional ability and status Nutritional status Physical activity Advanced directives List of other physicians Hospitalizations, surgeries, and ER visits in previous 12 months Vitals Screenings to include cognitive, depression, and falls Referrals and  appointments  In addition, I have reviewed and discussed with patient certain preventive protocols, quality metrics, and best practice recommendations. A written personalized care plan for preventive services as well as general preventive health recommendations were provided to patient.     Nikos Anglemyer L Tayveon Lombardo, CMA   04/10/2023   After Visit Summary: (MyChart) Due to this being a telephonic visit, the after visit summary with patients personalized plan was offered to patient via MyChart   Nurse Notes: Patient has no concerns today.

## 2023-04-10 NOTE — Patient Instructions (Signed)
Mr. Cory Thomas , Thank you for taking time to come for your Medicare Wellness Visit. I appreciate your ongoing commitment to your health goals. Please review the following plan we discussed and let me know if I can assist you in the future.   Referrals/Orders/Follow-Ups/Clinician Recommendations: Keep up the good work.  This is a list of the screening recommended for you and due dates:  Health Maintenance  Topic Date Due   COVID-19 Vaccine (72 - 2023-24 season) 08/31/2022   Colon Cancer Screening  03/29/2024*   Flu Shot  04/13/2023   Medicare Annual Wellness Visit  04/09/2024   DTaP/Tdap/Td vaccine (3 - Td or Tdap) 06/16/2025   Pneumonia Vaccine  Completed   Hepatitis C Screening  Completed   Zoster (Shingles) Vaccine  Completed   HPV Vaccine  Aged Out  *Topic was postponed. The date shown is not the original due date.    Advanced directives: (Copy Requested) Please bring a copy of your health care power of attorney and living will to the office to be added to your chart at your convenience.  Next Medicare Annual Wellness Visit scheduled for next year: Yes  Preventive Care 72 Years and Older, Male  Preventive care refers to lifestyle choices and visits with your health care provider that can promote health and wellness. What does preventive care include? A yearly physical exam. This is also called an annual well check. Dental exams once or twice a year. Routine eye exams. Ask your health care provider how often you should have your eyes checked. Personal lifestyle choices, including: Daily care of your teeth and gums. Regular physical activity. Eating a healthy diet. Avoiding tobacco and drug use. Limiting alcohol use. Practicing safe sex. Taking low doses of aspirin every day. Taking vitamin and mineral supplements as recommended by your health care provider. What happens during an annual well check? The services and screenings done by your health care provider during your  annual well check will depend on your age, overall health, lifestyle risk factors, and family history of disease. Counseling  Your health care provider may ask you questions about your: Alcohol use. Tobacco use. Drug use. Emotional well-being. Home and relationship well-being. Sexual activity. Eating habits. History of falls. Memory and ability to understand (cognition). Work and work Astronomer. Screening  You may have the following tests or measurements: Height, weight, and BMI. Blood pressure. Lipid and cholesterol levels. These may be checked every 5 years, or more frequently if you are over 41 years old. Skin check. Lung cancer screening. You may have this screening every year starting at age 81 if you have a 30-pack-year history of smoking and currently smoke or have quit within the past 15 years. Fecal occult blood test (FOBT) of the stool. You may have this test every year starting at age 41. Flexible sigmoidoscopy or colonoscopy. You may have a sigmoidoscopy every 5 years or a colonoscopy every 10 years starting at age 19. Prostate cancer screening. Recommendations will vary depending on your family history and other risks. Hepatitis C blood test. Hepatitis B blood test. Sexually transmitted disease (STD) testing. Diabetes screening. This is done by checking your blood sugar (glucose) after you have not eaten for a while (fasting). You may have this done every 1-3 years. Abdominal aortic aneurysm (AAA) screening. You may need this if you are a current or former smoker. Osteoporosis. You may be screened starting at age 14 if you are at high risk. Talk with your health care provider about your  test results, treatment options, and if necessary, the need for more tests. Vaccines  Your health care provider may recommend certain vaccines, such as: Influenza vaccine. This is recommended every year. Tetanus, diphtheria, and acellular pertussis (Tdap, Td) vaccine. You may need a Td  booster every 10 years. Zoster vaccine. You may need this after age 95. Pneumococcal 13-valent conjugate (PCV13) vaccine. One dose is recommended after age 33. Pneumococcal polysaccharide (PPSV23) vaccine. One dose is recommended after age 63. Talk to your health care provider about which screenings and vaccines you need and how often you need them. This information is not intended to replace advice given to you by your health care provider. Make sure you discuss any questions you have with your health care provider. Document Released: 09/25/2015 Document Revised: 05/18/2016 Document Reviewed: 06/30/2015 Elsevier Interactive Patient Education  2017 [employer].  Fall Prevention in the Home Falls can cause injuries. They can happen to people of all ages. There are many things you can do to make your home safe and to help prevent falls. What can I do on the outside of my home? Regularly fix the edges of walkways and driveways and fix any cracks. Remove anything that might make you trip as you walk through a door, such as a raised step or threshold. Trim any bushes or trees on the path to your home. Use bright outdoor lighting. Clear any walking paths of anything that might make someone trip, such as rocks or tools. Regularly check to see if handrails are loose or broken. Make sure that both sides of any steps have handrails. Any raised decks and porches should have guardrails on the edges. Have any leaves, snow, or ice cleared regularly. Use sand or salt on walking paths during winter. Clean up any spills in your garage right away. This includes oil or grease spills. What can I do in the bathroom? Use night lights. Install grab bars by the toilet and in the tub and shower. Do not use towel bars as grab bars. Use non-skid mats or decals in the tub or shower. If you need to sit down in the shower, use a plastic, non-slip stool. Keep the floor dry. Clean up any water that spills on the floor  as soon as it happens. Remove soap buildup in the tub or shower regularly. Attach bath mats securely with double-sided non-slip rug tape. Do not have throw rugs and other things on the floor that can make you trip. What can I do in the bedroom? Use night lights. Make sure that you have a light by your bed that is easy to reach. Do not use any sheets or blankets that are too big for your bed. They should not hang down onto the floor. Have a firm chair that has side arms. You can use this for support while you get dressed. Do not have throw rugs and other things on the floor that can make you trip. What can I do in the kitchen? Clean up any spills right away. Avoid walking on wet floors. Keep items that you use a lot in easy-to-reach places. If you need to reach something above you, use a strong step stool that has a grab bar. Keep electrical cords out of the way. Do not use floor polish or wax that makes floors slippery. If you must use wax, use non-skid floor wax. Do not have throw rugs and other things on the floor that can make you trip. What can I do with my  stairs? Do not leave any items on the stairs. Make sure that there are handrails on both sides of the stairs and use them. Fix handrails that are broken or loose. Make sure that handrails are as long as the stairways. Check any carpeting to make sure that it is firmly attached to the stairs. Fix any carpet that is loose or worn. Avoid having throw rugs at the top or bottom of the stairs. If you do have throw rugs, attach them to the floor with carpet tape. Make sure that you have a light switch at the top of the stairs and the bottom of the stairs. If you do not have them, ask someone to add them for you. What else can I do to help prevent falls? Wear shoes that: Do not have high heels. Have rubber bottoms. Are comfortable and fit you well. Are closed at the toe. Do not wear sandals. If you use a stepladder: Make sure that it is  fully opened. Do not climb a closed stepladder. Make sure that both sides of the stepladder are locked into place. Ask someone to hold it for you, if possible. Clearly mark and make sure that you can see: Any grab bars or handrails. First and last steps. Where the edge of each step is. Use tools that help you move around (mobility aids) if they are needed. These include: Canes. Walkers. Scooters. Crutches. Turn on the lights when you go into a dark area. Replace any light bulbs as soon as they burn out. Set up your furniture so you have a clear path. Avoid moving your furniture around. If any of your floors are uneven, fix them. If there are any pets around you, be aware of where they are. Review your medicines with your doctor. Some medicines can make you feel dizzy. This can increase your chance of falling. Ask your doctor what other things that you can do to help prevent falls. This information is not intended to replace advice given to you by your health care provider. Make sure you discuss any questions you have with your health care provider. Document Released: 06/25/2009 Document Revised: 02/04/2016 Document Reviewed: 10/03/2014 Elsevier Interactive Patient Education  2017 [employer].

## 2023-08-02 ENCOUNTER — Telehealth: Payer: Self-pay | Admitting: Gastroenterology

## 2023-08-02 NOTE — Telephone Encounter (Signed)
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 In looking up his recall procedure date it was moved from 03/2023 to 03/2026.  He wants to know why.  If someone  call him and explain please.

## 2023-08-02 NOTE — Telephone Encounter (Signed)
Called and spoke to patient. Explained to him that the national guidelines changed.  He is now due 03-2025. He said he doesn't remember getting the letter we mailed him in July.  The patient had no further questions at the end of the call

## 2023-08-31 DIAGNOSIS — D225 Melanocytic nevi of trunk: Secondary | ICD-10-CM | POA: Diagnosis not present

## 2023-08-31 DIAGNOSIS — H26493 Other secondary cataract, bilateral: Secondary | ICD-10-CM | POA: Diagnosis not present

## 2023-08-31 DIAGNOSIS — Z85828 Personal history of other malignant neoplasm of skin: Secondary | ICD-10-CM | POA: Diagnosis not present

## 2023-08-31 DIAGNOSIS — Z08 Encounter for follow-up examination after completed treatment for malignant neoplasm: Secondary | ICD-10-CM | POA: Diagnosis not present

## 2023-08-31 DIAGNOSIS — L820 Inflamed seborrheic keratosis: Secondary | ICD-10-CM | POA: Diagnosis not present

## 2023-08-31 DIAGNOSIS — L821 Other seborrheic keratosis: Secondary | ICD-10-CM | POA: Diagnosis not present

## 2023-08-31 DIAGNOSIS — Z86006 Personal history of melanoma in-situ: Secondary | ICD-10-CM | POA: Diagnosis not present

## 2023-08-31 DIAGNOSIS — Z789 Other specified health status: Secondary | ICD-10-CM | POA: Diagnosis not present

## 2023-08-31 DIAGNOSIS — L814 Other melanin hyperpigmentation: Secondary | ICD-10-CM | POA: Diagnosis not present

## 2023-08-31 DIAGNOSIS — L2989 Other pruritus: Secondary | ICD-10-CM | POA: Diagnosis not present

## 2023-08-31 DIAGNOSIS — L538 Other specified erythematous conditions: Secondary | ICD-10-CM | POA: Diagnosis not present

## 2024-03-01 ENCOUNTER — Telehealth: Payer: Self-pay | Admitting: Gastroenterology

## 2024-03-01 NOTE — Telephone Encounter (Signed)
 Patient called and stated that he is needing to have a colonoscopy done for the month of July. Patient was advised that Dr. General Kenner did not have any availability. Patient is requesting to speak to the nurse to see when he can possible be schedule. Please advise.

## 2024-03-01 NOTE — Telephone Encounter (Signed)
 Patient calls to schedule his recall colonoscopy for July. Patient is advised that his colonoscopy date was changed to 03/2025 (see 08/02/23 telephone note and 03/14/23 letter) due to new nationally recognized guidelines for polyp surveillance. Patient says he received a letter stating colonoscopy had been changed to 03/2024, but never got one for change to 03/2025.  Patient denies any current GI symptoms; no rectal bleeding, melena, abdominal pain, change in bowels etc. No family history of CRC.   For patient's clarity, would you please take another look at last colonoscopy and advise if 03/2025 procedure is still the recommendation?

## 2024-03-01 NOTE — Telephone Encounter (Signed)
 Patient is advised of recommendation for colonoscopy recall 03/2025. He verbalizes understanding.

## 2024-03-01 NOTE — Telephone Encounter (Signed)
 Based on up to date guidelines, given he had one small adenoma on his last exam, he would be due 7 years from his last exam, thus due 03/2025.   Otherwise, if he has had a change in his family history with a first degree family member diagnosed with colon cancer at age 73 or less, then we would do it now.  Thanks

## 2024-03-05 ENCOUNTER — Encounter: Payer: Self-pay | Admitting: Emergency Medicine

## 2024-04-22 ENCOUNTER — Ambulatory Visit: Payer: Self-pay | Admitting: Emergency Medicine

## 2024-04-22 ENCOUNTER — Ambulatory Visit (INDEPENDENT_AMBULATORY_CARE_PROVIDER_SITE_OTHER)

## 2024-04-22 ENCOUNTER — Encounter: Payer: Self-pay | Admitting: Emergency Medicine

## 2024-04-22 ENCOUNTER — Ambulatory Visit (INDEPENDENT_AMBULATORY_CARE_PROVIDER_SITE_OTHER): Admitting: Emergency Medicine

## 2024-04-22 VITALS — BP 104/78 | HR 67 | Temp 97.6°F | Ht 70.0 in | Wt 143.0 lb

## 2024-04-22 VITALS — BP 104/78 | HR 67 | Ht 70.0 in | Wt 143.0 lb

## 2024-04-22 DIAGNOSIS — Z13228 Encounter for screening for other metabolic disorders: Secondary | ICD-10-CM | POA: Diagnosis not present

## 2024-04-22 DIAGNOSIS — Z125 Encounter for screening for malignant neoplasm of prostate: Secondary | ICD-10-CM | POA: Diagnosis not present

## 2024-04-22 DIAGNOSIS — Z13220 Encounter for screening for lipoid disorders: Secondary | ICD-10-CM

## 2024-04-22 DIAGNOSIS — Z0000 Encounter for general adult medical examination without abnormal findings: Secondary | ICD-10-CM

## 2024-04-22 DIAGNOSIS — R29818 Other symptoms and signs involving the nervous system: Secondary | ICD-10-CM | POA: Diagnosis not present

## 2024-04-22 DIAGNOSIS — K635 Polyp of colon: Secondary | ICD-10-CM | POA: Diagnosis not present

## 2024-04-22 DIAGNOSIS — Z1329 Encounter for screening for other suspected endocrine disorder: Secondary | ICD-10-CM

## 2024-04-22 DIAGNOSIS — Z130 Encounter for screening for diseases of the blood and blood-forming organs and certain disorders involving the immune mechanism: Secondary | ICD-10-CM | POA: Diagnosis not present

## 2024-04-22 LAB — COMPREHENSIVE METABOLIC PANEL WITH GFR
ALT: 24 U/L (ref 0–53)
AST: 24 U/L (ref 0–37)
Albumin: 4.1 g/dL (ref 3.5–5.2)
Alkaline Phosphatase: 74 U/L (ref 39–117)
BUN: 23 mg/dL (ref 6–23)
CO2: 29 meq/L (ref 19–32)
Calcium: 9.1 mg/dL (ref 8.4–10.5)
Chloride: 102 meq/L (ref 96–112)
Creatinine, Ser: 0.74 mg/dL (ref 0.40–1.50)
GFR: 90.12 mL/min (ref >60.00)
Glucose, Bld: 92 mg/dL (ref 70–99)
Potassium: 5 meq/L (ref 3.5–5.1)
Sodium: 137 meq/L (ref 135–145)
Total Bilirubin: 0.8 mg/dL (ref 0.2–1.2)
Total Protein: 5.9 g/dL — ABNORMAL LOW (ref 6.0–8.3)

## 2024-04-22 LAB — CBC WITH DIFFERENTIAL/PLATELET
Basophils Absolute: 0.1 K/uL (ref 0.0–0.1)
Basophils Relative: 1 % (ref 0.0–3.0)
Eosinophils Absolute: 0.2 K/uL (ref 0.0–0.7)
Eosinophils Relative: 4.4 % (ref 0.0–5.0)
HCT: 45.9 % (ref 39.0–52.0)
Hemoglobin: 15.1 g/dL (ref 13.0–17.0)
Lymphocytes Relative: 21.6 % (ref 12.0–46.0)
Lymphs Abs: 1.1 K/uL (ref 0.7–4.0)
MCHC: 32.9 g/dL (ref 30.0–36.0)
MCV: 92.8 fl (ref 78.0–100.0)
Monocytes Absolute: 0.4 K/uL (ref 0.1–1.0)
Monocytes Relative: 8.4 % (ref 3.0–12.0)
Neutro Abs: 3.3 K/uL (ref 1.4–7.7)
Neutrophils Relative %: 64.6 % (ref 43.0–77.0)
Platelets: 183 K/uL (ref 150.0–400.0)
RBC: 4.95 Mil/uL (ref 4.22–5.81)
RDW: 13.9 % (ref 11.5–15.5)
WBC: 5.2 K/uL (ref 4.0–10.5)

## 2024-04-22 LAB — LIPID PANEL
Cholesterol: 132 mg/dL (ref 0–200)
HDL: 53.8 mg/dL (ref >39.00)
LDL Cholesterol: 65 mg/dL (ref 0–99)
NonHDL: 78.05
Total CHOL/HDL Ratio: 2
Triglycerides: 66 mg/dL (ref 0.0–149.0)
VLDL: 13.2 mg/dL (ref 0.0–40.0)

## 2024-04-22 LAB — HEMOGLOBIN A1C: Hgb A1c MFr Bld: 6.2 % (ref 4.6–6.5)

## 2024-04-22 LAB — VITAMIN B12: Vitamin B-12: 562 pg/mL (ref 211–911)

## 2024-04-22 LAB — PSA: PSA: 3.53 ng/mL (ref 0.10–4.00)

## 2024-04-22 LAB — VITAMIN D 25 HYDROXY (VIT D DEFICIENCY, FRACTURES): VITD: 77.62 ng/mL (ref 30.00–100.00)

## 2024-04-22 LAB — TSH: TSH: 0.98 u[IU]/mL (ref 0.35–5.50)

## 2024-04-22 NOTE — Progress Notes (Signed)
 Cory Thomas 73 y.o.   Chief Complaint  Patient presents with   Peripheral Neuropathy   Annual Exam    Patient here for physical and has concerns of neuropathy, and want to discuss Lewy body dementia. Also wants to see if he needs a sleep study.     HISTORY OF PRESENT ILLNESS: This is a 73 y.o. male Here for annual exam. Healthy male with a healthy lifestyle Concerned about sleep apnea.  Snores.  No daytime symptoms. No other complaints or medical concerns today.  HPI   Prior to Admission medications   Medication Sig Start Date End Date Taking? Authorizing Provider  Alpha-D-Galactosidase THAD) TABS Take 2 tablets by mouth daily. 800 Galu    [provider]  calcium  carbonate (OS-CAL - DOSED IN MG OF ELEMENTAL CALCIUM ) 1250 (500 Ca) MG tablet Take 1 tablet by mouth.    [provider]  Cholecalciferol  (VITAMIN D3) 50 MCG (2000 UT) TABS Take 6,000 Units by mouth daily.    [provider]  zinc gluconate 50 MG tablet Take 25 mg by mouth daily.    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     Past Medical History:  Diagnosis Date   Allergy    Anxiety    per pt after father died   Asthma    age 58   Cancer (HCC)    skin on arms and legs years ago rmoved over 10 yrs ago   Gastritis    years ago   Osteoporosis    Refusal of blood product     Past Surgical History:  Procedure Laterality Date   Cataract surgery  Bilateral 2023   CLOSED REDUCTION WRIST FRACTURE     73 years old   ESOPHAGOGASTRODUODENOSCOPY N/A 03/05/2020   Procedure: ESOPHAGOGASTRODUODENOSCOPY (EGD);  Surgeon: Teressa Toribio SQUIBB, MD;  Location: [facility];  Service: Endoscopy;  Laterality: N/A;   EUS N/A 03/05/2020   Procedure: UPPER ENDOSCOPIC ULTRASOUND (EUS) LINEAR;  Surgeon: Teressa Toribio SQUIBB, MD;  Location: [facility];  Service: Endoscopy;  Laterality: N/A;   FINE NEEDLE ASPIRATION N/A 03/05/2020   Procedure: FINE NEEDLE ASPIRATION (FNA) LINEAR;  Surgeon: Teressa Toribio SQUIBB, MD;  Location: [facility];  Service: Endoscopy;  Laterality: N/A;   POLYPECTOMY  12/2018   colon   TONSILLECTOMY     WISDOM TOOTH EXTRACTION      Social History   Socioeconomic History   Marital status: Married    Spouse name: Pat   Number of children: Not on file   Years of education: Not on file   Highest education level: Associate degree: occupational, Scientist, product/process development, or vocational program  Occupational History   Occupation: Radiographer, therapeutic Retired  Tobacco Use   Smoking status: Never   Smokeless tobacco: Never  Vaping Use   Vaping status: Never Used  Substance and Sexual Activity   Alcohol use: Yes    Alcohol/week: 3.0 standard drinks of alcohol  Types: 3 Glasses of wine per week    Comment: 1-2 bottles of beer/week, red wine   Drug use: Never   Sexual activity: Not on file  Other Topics Concern   Not on file  Social History Narrative   Jehovah's Witness   From [place]    Likes to hike   Social Drivers of Health   Financial Resource Strain: Low Risk  (04/19/2024)   Overall Financial Resource Strain (CARDIA)    Difficulty of Paying Living Expenses: Not hard at all  Food Insecurity: No Food Insecurity (04/19/2024)   Hunger Vital Sign    Worried About Running Out of Food in the Last Year: Never true    Ran Out of Food in the Last Year: Never true  Transportation Needs: No Transportation Needs (04/19/2024)   PRAPARE - Therapist, art (Medical): No    Lack of Transportation (Non-Medical): No  Physical Activity: Sufficiently Active (04/19/2024)   Exercise Vital Sign    Days of Exercise per Week: 7 days    Minutes of Exercise per Session: 50 min  Stress: No Stress Concern Present (04/19/2024)   [employer] of Occupational Health - Occupational Stress Questionnaire    Feeling of Stress: Not at all  Social Connections: Socially Integrated (04/19/2024)   Social Connection and Isolation Panel    Frequency of Communication with Friends and Family: More than three times a week    Frequency of Social Gatherings with Friends and Family: Twice a week    Attends Religious Services: More than 4 times per year    Active Member of [employer] or Organizations: Yes    Attends Engineer, structural: More than 4 times per year    Marital Status: Married  Catering manager Violence: Not At Risk (04/10/2023)   Humiliation, Afraid, Rape, and Kick questionnaire    Fear of Current or Ex-Partner: No    Emotionally Abused: No    Physically Abused: No    Sexually Abused: No    Family History  Problem Relation Age of Onset   Dementia Father    Colon cancer Neg Hx    Colon polyps Neg Hx    Esophageal cancer Neg Hx    Rectal cancer Neg Hx    Stomach cancer Neg Hx      Review of Systems  Constitutional: Negative.  Negative for chills and fever.  HENT: Negative.  Negative for congestion and sore throat.   Respiratory: Negative.  Negative for cough and shortness of breath.   Cardiovascular: Negative.  Negative for chest pain and palpitations.  Gastrointestinal:  Negative for abdominal pain, diarrhea, nausea and vomiting.  Genitourinary: Negative.   Skin: Negative.  Negative for rash.  Neurological:  Negative for dizziness and headaches.  All other systems reviewed and are negative.   Today's Vitals   04/22/24 1257  BP: 104/78  Pulse: 67  Temp: 97.6 F (36.4 C)  TempSrc: Oral  SpO2: 97%  Weight: 143  lb (64.9 kg)  Height: 5' 10 (1.778 m)   Body mass index is 20.52 kg/m.   Physical Exam Vitals reviewed.  Constitutional:      Appearance: Normal appearance.  HENT:     Head: Normocephalic.     Right Ear: Tympanic membrane, ear canal and external ear normal.     Left Ear: Tympanic membrane, ear canal and external ear normal.     Mouth/Throat:     Mouth: Mucous membranes are moist.     Pharynx:  Oropharynx is clear.  Eyes:     Extraocular Movements: Extraocular movements intact.     Conjunctiva/sclera: Conjunctivae normal.     Pupils: Pupils are equal, round, and reactive to light.  Cardiovascular:     Rate and Rhythm: Normal rate and regular rhythm.     Pulses: Normal pulses.     Heart sounds: Normal heart sounds.  Pulmonary:     Effort: Pulmonary effort is normal.     Breath sounds: Normal breath sounds.  Abdominal:     Palpations: Abdomen is soft.     Tenderness: There is no abdominal tenderness.  Musculoskeletal:     Cervical back: No tenderness.  Lymphadenopathy:     Cervical: No cervical adenopathy.  Skin:    General: Skin is warm and dry.  Neurological:     Mental Status: He is alert and oriented to person, place, and time.  Psychiatric:        Mood and Affect: Mood normal.        Behavior: Behavior normal.      ASSESSMENT & PLAN: Problem List Items Addressed This Visit   None Visit Diagnoses       Routine general medical examination at a health care facility    -  Primary   Relevant Orders   Vitamin B12   VITAMIN D  25 Hydroxy (Vit-D Deficiency, Fractures)   TSH   PSA   CBC with Differential/Platelet   Comprehensive metabolic panel with GFR   Hemoglobin A1c   Lipid panel     Screening for deficiency anemia       Relevant Orders   CBC with Differential/Platelet     Screening for lipoid disorders       Relevant Orders   Lipid panel     Screening for endocrine, metabolic and immunity disorder       Relevant Orders   Vitamin B12   VITAMIN D  25  Hydroxy (Vit-D Deficiency, Fractures)   TSH   Comprehensive metabolic panel with GFR   Hemoglobin A1c     Screening for prostate cancer       Relevant Orders   PSA     Suspected sleep apnea       Relevant Orders   Ambulatory referral to Neurology      Modifiable risk factors discussed with patient. Anticipatory guidance according to age provided. The following topics were also discussed: Social Determinants of Health Smoking.  Non-smoker Diet and nutrition.  Good eating habits Benefits of exercise.  Exercises regularly Cancer screening and review of most recent colonoscopy report Vaccinations review and recommendations Cardiovascular risk assessment and need for blood work The 10-year ASCVD risk score (Arnett DK, et al., 2019) is: 14%   Values used to calculate the score:     Age: 28 years     Clincally relevant sex: Male     Is Non-Hispanic African American: No     Diabetic: No     Tobacco smoker: No     Systolic Blood Pressure: 104 mmHg     Is BP treated: No     HDL Cholesterol: 49.9 mg/dL     Total Cholesterol: 147 mg/dL Risk is 85% Mental health including depression and anxiety Fall and accident prevention  Patient Instructions  Health Maintenance, Male Adopting a healthy lifestyle and getting preventive care are important in promoting health and wellness. Ask your health care provider about: The right schedule for you to have regular tests and exams. Things you can do on your  own to prevent diseases and keep yourself healthy. What should I know about diet, weight, and exercise? Eat a healthy diet  Eat a diet that includes plenty of vegetables, fruits, low-fat dairy products, and lean protein. Do not eat a lot of foods that are high in solid fats, added sugars, or sodium. Maintain a healthy weight Body mass index (BMI) is a measurement that can be used to identify possible weight problems. It estimates body fat based on height and weight. Your health care provider  can help determine your BMI and help you achieve or maintain a healthy weight. Get regular exercise Get regular exercise. This is one of the most important things you can do for your health. Most adults should: Exercise for at least 150 minutes each week. The exercise should increase your heart rate and make you sweat (moderate-intensity exercise). Do strengthening exercises at least twice a week. This is in addition to the moderate-intensity exercise. Spend less time sitting. Even light physical activity can be beneficial. Watch cholesterol and blood lipids Have your blood tested for lipids and cholesterol at 73 years of age, then have this test every 5 years. You may need to have your cholesterol levels checked more often if: Your lipid or cholesterol levels are high. You are older than 73 years of age. You are at high risk for heart disease. What should I know about cancer screening? Many types of cancers can be detected early and may often be prevented. Depending on your health history and family history, you may need to have cancer screening at various ages. This may include screening for: Colorectal cancer. Prostate cancer. Skin cancer. Lung cancer. What should I know about heart disease, diabetes, and high blood pressure? Blood pressure and heart disease High blood pressure causes heart disease and increases the risk of stroke. This is more likely to develop in people who have high blood pressure readings or are overweight. Talk with your health care provider about your target blood pressure readings. Have your blood pressure checked: Every 3-5 years if you are 38-70 years of age. Every year if you are 31 years old or older. If you are between the ages of 66 and 110 and are a current or former smoker, ask your health care provider if you should have a one-time screening for abdominal aortic aneurysm (AAA). Diabetes Have regular diabetes screenings. This checks your fasting blood  sugar level. Have the screening done: Once every three years after age 30 if you are at a normal weight and have a low risk for diabetes. More often and at a younger age if you are overweight or have a high risk for diabetes. What should I know about preventing infection? Hepatitis B If you have a higher risk for hepatitis B, you should be screened for this virus. Talk with your health care provider to find out if you are at risk for hepatitis B infection. Hepatitis C Blood testing is recommended for: Everyone born from [birth year] through 1965. Anyone with known risk factors for hepatitis C. Sexually transmitted infections (STIs) You should be screened each year for STIs, including gonorrhea and chlamydia, if: You are sexually active and are younger than 73 years of age. You are older than 73 years of age and your health care provider tells you that you are at risk for this type of infection. Your sexual activity has changed since you were last screened, and you are at increased risk for chlamydia or gonorrhea. Ask your health care provider  if you are at risk. Ask your health care provider about whether you are at high risk for HIV. Your health care provider may recommend a prescription medicine to help prevent HIV infection. If you choose to take medicine to prevent HIV, you should first get tested for HIV. You should then be tested every 3 months for as long as you are taking the medicine. Follow these instructions at home: Alcohol use Do not drink alcohol if your health care provider tells you not to drink. If you drink alcohol: Limit how much you have to 0-2 drinks a day. Know how much alcohol is in your drink. In the [place] one drink equals one 12 oz bottle of beer (355 mL), one 5 oz glass of wine (148 mL), or one 1 oz glass of hard liquor (44 mL). Lifestyle Do not use any products that contain nicotine or tobacco. These products include cigarettes, chewing tobacco, and vaping devices, such as  e-cigarettes. If you need help quitting, ask your health care provider. Do not use street drugs. Do not share needles. Ask your health care provider for help if you need support or information about quitting drugs. General instructions Schedule regular health, dental, and eye exams. Stay current with your vaccines. Tell your health care provider if: You often feel depressed. You have ever been abused or do not feel safe at home. Summary Adopting a healthy lifestyle and getting preventive care are important in promoting health and wellness. Follow your health care provider's instructions about healthy diet, exercising, and getting tested or screened for diseases. Follow your health care provider's instructions on monitoring your cholesterol and blood pressure. This information is not intended to replace advice given to you by your health care provider. Make sure you discuss any questions you have with your health care provider. Document Revised: 01/18/2021 Document Reviewed: 01/18/2021 Elsevier Patient Education  2024 Elsevier Inc.    Emil Schaumann, MD  Primary Care at [facility]

## 2024-04-22 NOTE — Patient Instructions (Signed)
 Health Maintenance, Male  Adopting a healthy lifestyle and getting preventive care are important in promoting health and wellness. Ask your health care provider about:  The right schedule for you to have regular tests and exams.  Things you can do on your own to prevent diseases and keep yourself healthy.  What should I know about diet, weight, and exercise?  Eat a healthy diet    Eat a diet that includes plenty of vegetables, fruits, low-fat dairy products, and lean protein.  Do not eat a lot of foods that are high in solid fats, added sugars, or sodium.  Maintain a healthy weight  Body mass index (BMI) is a measurement that can be used to identify possible weight problems. It estimates body fat based on height and weight. Your health care provider can help determine your BMI and help you achieve or maintain a healthy weight.  Get regular exercise  Get regular exercise. This is one of the most important things you can do for your health. Most adults should:  Exercise for at least 150 minutes each week. The exercise should increase your heart rate and make you sweat (moderate-intensity exercise).  Do strengthening exercises at least twice a week. This is in addition to the moderate-intensity exercise.  Spend less time sitting. Even light physical activity can be beneficial.  Watch cholesterol and blood lipids  Have your blood tested for lipids and cholesterol at 73 years of age, then have this test every 5 years.  You may need to have your cholesterol levels checked more often if:  Your lipid or cholesterol levels are high.  You are older than 73 years of age.  You are at high risk for heart disease.  What should I know about cancer screening?  Many types of cancers can be detected early and may often be prevented. Depending on your health history and family history, you may need to have cancer screening at various ages. This may include screening for:  Colorectal cancer.  Prostate cancer.  Skin cancer.  Lung  cancer.  What should I know about heart disease, diabetes, and high blood pressure?  Blood pressure and heart disease  High blood pressure causes heart disease and increases the risk of stroke. This is more likely to develop in people who have high blood pressure readings or are overweight.  Talk with your health care provider about your target blood pressure readings.  Have your blood pressure checked:  Every 3-5 years if you are 24-52 years of age.  Every year if you are 3 years old or older.  If you are between the ages of 60 and 72 and are a current or former smoker, ask your health care provider if you should have a one-time screening for abdominal aortic aneurysm (AAA).  Diabetes  Have regular diabetes screenings. This checks your fasting blood sugar level. Have the screening done:  Once every three years after age 66 if you are at a normal weight and have a low risk for diabetes.  More often and at a younger age if you are overweight or have a high risk for diabetes.  What should I know about preventing infection?  Hepatitis B  If you have a higher risk for hepatitis B, you should be screened for this virus. Talk with your health care provider to find out if you are at risk for hepatitis B infection.  Hepatitis C  Blood testing is recommended for:  Everyone born from [birth year] through 1965.  Anyone  with known risk factors for hepatitis C.  Sexually transmitted infections (STIs)  You should be screened each year for STIs, including gonorrhea and chlamydia, if:  You are sexually active and are younger than 73 years of age.  You are older than 73 years of age and your health care provider tells you that you are at risk for this type of infection.  Your sexual activity has changed since you were last screened, and you are at increased risk for chlamydia or gonorrhea. Ask your health care provider if you are at risk.  Ask your health care provider about whether you are at high risk for HIV. Your health care provider  may recommend a prescription medicine to help prevent HIV infection. If you choose to take medicine to prevent HIV, you should first get tested for HIV. You should then be tested every 3 months for as long as you are taking the medicine.  Follow these instructions at home:  Alcohol use  Do not drink alcohol if your health care provider tells you not to drink.  If you drink alcohol:  Limit how much you have to 0-2 drinks a day.  Know how much alcohol is in your drink. In the [place] one drink equals one 12 oz bottle of beer (355 mL), one 5 oz glass of wine (148 mL), or one 1 oz glass of hard liquor (44 mL).  Lifestyle  Do not use any products that contain nicotine or tobacco. These products include cigarettes, chewing tobacco, and vaping devices, such as e-cigarettes. If you need help quitting, ask your health care provider.  Do not use street drugs.  Do not share needles.  Ask your health care provider for help if you need support or information about quitting drugs.  General instructions  Schedule regular health, dental, and eye exams.  Stay current with your vaccines.  Tell your health care provider if:  You often feel depressed.  You have ever been abused or do not feel safe at home.  Summary  Adopting a healthy lifestyle and getting preventive care are important in promoting health and wellness.  Follow your health care provider's instructions about healthy diet, exercising, and getting tested or screened for diseases.  Follow your health care provider's instructions on monitoring your cholesterol and blood pressure.  This information is not intended to replace advice given to you by your health care provider. Make sure you discuss any questions you have with your health care provider.  Document Revised: 01/18/2021 Document Reviewed: 01/18/2021  Elsevier Patient Education  2024 [employer].

## 2024-04-22 NOTE — Patient Instructions (Signed)
 Mr. Cory Thomas , Thank you for taking time out of your busy schedule to complete your Annual Wellness Visit with me. I enjoyed our conversation and look forward to speaking with you again next year. I, as well as your care team,  appreciate your ongoing commitment to your health goals. Please review the following plan we discussed and let me know if I can assist you in the future. Your Game plan/ To Do List    Referrals: If you haven't heard from the office you've been referred to, please reach out to them at the phone provided. Referral to Dr Leigh for a repeat Colonoscopy  Follow up Visits: We will see or speak with you next year for your Next Medicare AWV with our clinical staff Have you seen your provider in the last 6 months (3 months if uncontrolled diabetes)? No  Clinician Recommendations:  Aim for 30 minutes of exercise or brisk walking, 6-8 glasses of water, and 5 servings of fruits and vegetables each day.       This is a list of the screenings recommended for you:  Health Maintenance  Topic Date Due   Colon Cancer Screening  03/30/2023   COVID-19 Vaccine (6 - 2024-25 season) 09/12/2024*   Flu Shot  12/10/2024*   Medicare Annual Wellness Visit  04/22/2025   DTaP/Tdap/Td vaccine (3 - Td or Tdap) 06/16/2025   Pneumococcal Vaccine for age over 57  Completed   Hepatitis C Screening  Completed   Zoster (Shingles) Vaccine  Completed   Hepatitis B Vaccine  Aged Out   HPV Vaccine  Aged Out   Meningitis B Vaccine  Aged Out  *Topic was postponed. The date shown is not the original due date.    Advanced directives: (In Chart) A copy of your advanced directives are scanned into your chart should your provider ever need it. Advance Care Planning is important because it:  [x]  Makes sure you receive the medical care that is consistent with your values, goals, and preferences  [x]  It provides guidance to your family and loved ones and reduces their decisional burden about whether or  not they are making the right decisions based on your wishes.  Follow the link provided in your after visit summary or read over the paperwork we have mailed to you to help you started getting your Advance Directives in place. If you need assistance in completing these, please reach out to us  so that we can help you!

## 2024-04-22 NOTE — Progress Notes (Signed)
 Subjective:   Cory Thomas is a 73 y.o. who presents for a Medicare Wellness preventive visit.  As a reminder, Annual Wellness Visits don't include a physical exam, and some assessments may be limited, especially if this visit is performed virtually. We may recommend an in-person follow-up visit with your provider if needed.  Visit Complete: In person  Persons Participating in Visit: Patient.  AWV Questionnaire: Yes: Patient Medicare AWV questionnaire was completed by the patient on 04/19/2024; I have confirmed that all information answered by patient is correct and no changes since this date.  Cardiac Risk Factors include: advanced age (>37men, >86 women);male gender;Other (see comment), Risk factor comments: Pre-Diabetes     Objective:    Today's Vitals   04/22/24 1356  BP: 104/78  Pulse: 67  SpO2: 97%  Weight: 143 lb (64.9 kg)  Height: 5' 10 (1.778 m)   Body mass index is 20.52 kg/m.     04/22/2024    1:56 PM 04/10/2023    1:50 PM 11/27/2022   12:47 PM 07/26/2022    2:38 PM 07/14/2021    1:24 PM 06/24/2020    7:23 AM 06/16/2020    1:49 PM  Advanced Directives  Does Patient Have a Medical Advance Directive? Yes Yes No Yes Yes Yes Yes  Type of Estate agent of [place];Living will Healthcare Power of [place];Living will  Healthcare Power of [place];Living will  Healthcare Power of [place];Living will   Does patient want to make changes to medical advance directive? No - Patient declined   No - Patient declined No - Patient declined No - Patient declined Yes (MAU/Ambulatory/Procedural Areas - Information given)  Copy of Healthcare Power of Attorney in Chart? Yes - validated most recent copy scanned in chart (See row information) No - copy requested  Yes - validated most recent copy scanned in chart (See row information)  Yes - validated most recent copy scanned in chart (See row information)   Would patient like information on creating a medical  advance directive?   No - Patient declined        Current Medications (verified) Outpatient Encounter Medications as of 04/22/2024  Medication Sig   Alpha-D-Galactosidase (BEANO) TABS Take 2 tablets by mouth daily. 800 Galu   calcium  carbonate (OS-CAL - DOSED IN MG OF ELEMENTAL CALCIUM ) 1250 (500 Ca) MG tablet Take 1 tablet by mouth.   Cholecalciferol  (VITAMIN D3) 50 MCG (2000 UT) TABS Take 6,000 Units by mouth daily.   Cyanocobalamin  (VITAMIN B-12) 2000 MCG TBCR    Zinc Gluconate 10 MG LOZG    zinc gluconate 50 MG tablet Take 25 mg by mouth daily.   No facility-administered encounter medications on file as of 04/22/2024.    Allergies (verified) Other   History: Past Medical History:  Diagnosis Date   Allergy    Anxiety    per pt after father died   Asthma    age 25   Cancer (HCC)    skin on arms and legs years ago rmoved over 10 yrs ago   Gastritis    years ago   Osteoporosis    Refusal of blood product    Past Surgical History:  Procedure Laterality Date   Cataract surgery Bilateral 2023   CLOSED REDUCTION WRIST FRACTURE     73 years old   ESOPHAGOGASTRODUODENOSCOPY N/A 03/05/2020   Procedure: ESOPHAGOGASTRODUODENOSCOPY (EGD);  Surgeon: Teressa Toribio SQUIBB, MD;  Location: [facility];  Service: Endoscopy;  Laterality: N/A;   EUS N/A  03/05/2020   Procedure: UPPER ENDOSCOPIC ULTRASOUND (EUS) LINEAR;  Surgeon: Teressa Toribio SQUIBB, MD;  Location: [facility];  Service: Endoscopy;  Laterality: N/A;   FINE NEEDLE ASPIRATION N/A 03/05/2020   Procedure: FINE NEEDLE ASPIRATION (FNA) LINEAR;  Surgeon: Teressa Toribio SQUIBB, MD;  Location: [facility];  Service: Endoscopy;  Laterality: N/A;   POLYPECTOMY  12/2018   colon   TONSILLECTOMY     WISDOM TOOTH EXTRACTION     Family History  Problem Relation Age of Onset   Dementia Father    Colon cancer Neg Hx    Colon polyps Neg Hx    Esophageal cancer Neg Hx    Rectal cancer Neg Hx    Stomach cancer Neg Hx    Social History    Socioeconomic History   Marital status: Married    Spouse name: Garment/textile technologist   Number of children: Not on file   Years of education: Not on file   Highest education level: Associate degree: occupational, Scientist, product/process development, or vocational program  Occupational History   Occupation: Radiographer, therapeutic Retired  Tobacco Use   Smoking status: Never   Smokeless tobacco: Never  Vaping Use   Vaping status: Never Used  Substance and Sexual Activity   Alcohol use: Yes    Alcohol/week: 3.0 standard drinks of alcohol    Types: 3 Glasses of wine per week    Comment: 1-2 bottles of beer/week, red wine   Drug use: Never   Sexual activity: Yes  Other Topics Concern   Not on file  Social History Narrative   Jehovah's Witness   From [place]    Likes to hike   Social Drivers of Health   Financial Resource Strain: Low Risk  (04/22/2024)   Overall Financial Resource Strain (CARDIA)    Difficulty of Paying Living Expenses: Not hard at all  Food Insecurity: No Food Insecurity (04/22/2024)   Hunger Vital Sign    Worried About Running Out of Food in the Last Year: Never true    Ran Out of Food in the Last Year: Never true  Transportation Needs: No Transportation Needs (04/22/2024)   PRAPARE - Administrator, Civil Service (Medical): No    Lack of Transportation (Non-Medical): No  Physical Activity: Sufficiently Active (04/22/2024)   Exercise Vital Sign    Days of Exercise per Week: 7 days    Minutes of Exercise per Session: 50 min  Stress: No Stress Concern Present (04/22/2024)   [employer] of Occupational Health - Occupational Stress Questionnaire    Feeling of Stress: Not at all  Social Connections: Socially Integrated (04/22/2024)   Social Connection and Isolation Panel    Frequency of Communication with Friends and Family: More than three times a week    Frequency of Social Gatherings with Friends and Family: Twice a week    Attends Religious Services: More than 4 times per year    Active  Member of [employer] or Organizations: Yes    Attends Engineer, structural: More than 4 times per year    Marital Status: Married    Tobacco Counseling Counseling given: No    Clinical Intake:  Pre-visit preparation completed: Yes  Pain : No/denies pain     BMI - recorded: 20.52 Nutritional Risks: None Diabetes: No  Lab Results  Component Value Date   HGBA1C 5.9 03/22/2023   HGBA1C 5.9 (H) 02/09/2022   HGBA1C 5.9 01/21/2021     How often do you need to have someone help you  when you read instructions, pamphlets, or other written materials from your doctor or pharmacy?: 1 - Never  Interpreter Needed?: No  Information entered by :: Verdie Saba, CMA   Activities of Daily Living     04/22/2024    1:58 PM 04/19/2024    3:49 PM  In your present state of health, do you have any difficulty performing the following activities:  Hearing? 0 0  Vision? 0 0  Difficulty concentrating or making decisions? 0 0  Walking or climbing stairs? 0 0  Dressing or bathing? 0 0  Doing errands, shopping? 0 0  Preparing Food and eating ? N N  Using the Toilet? N N  In the past six months, have you accidently leaked urine? N N  Do you have problems with loss of bowel control? N N  Managing your Medications? N N  Managing your Finances? N N  Housekeeping or managing your Housekeeping? N N    Patient Care Team: Purcell Emil Schanz, MD as PCP - General (Internal Medicine) Sheldon Standing, MD as Consulting Physician (General Surgery) Armbruster, Standing SQUIBB, MD as Consulting Physician (Gastroenterology) Maree Lonni Inks, MD as Consulting Physician (Ophthalmology)  I have updated your Care Teams any recent Medical Services you may have received from other providers in the past year.     Assessment:   This is a routine wellness examination for [place].  Hearing/Vision screen Hearing Screening - Comments:: Denies hearing difficulties   Vision Screening - Comments:: Wears rx  glasses - Plans to contact [facility] for an appt.   Goals Addressed               This Visit's Progress     Patient Stated (pt-stated)        Patient stated he plans to continue to have quality of life - travel around the world       Depression Screen     04/22/2024    1:59 PM 04/22/2024    1:03 PM 04/10/2023    2:21 PM 03/22/2023    9:46 AM 07/26/2022    2:16 PM 03/28/2022    1:14 PM 02/09/2022    8:00 AM  PHQ 2/9 Scores  PHQ - 2 Score 0 0 0 0 0 0 0  PHQ- 9 Score 0  0        Fall Risk     04/22/2024    1:59 PM 04/22/2024    1:03 PM 04/19/2024    3:49 PM 04/10/2023    1:50 PM 04/10/2023    7:16 AM  Fall Risk   Falls in the past year? 0 0 0 0 0  Number falls in past yr: 0 0 0 0 0  Injury with Fall? 0 0 0  0  Risk for fall due to : No Fall Risks No Fall Risks  No Fall Risks   Follow up Falls evaluation completed;Falls prevention discussed Falls evaluation completed       MEDICARE RISK AT HOME:  Medicare Risk at Home Any stairs in or around the home?: Yes If so, are there any without handrails?: Yes Home free of loose throw rugs in walkways, pet beds, electrical cords, etc?: Yes Adequate lighting in your home to reduce risk of falls?: Yes Life alert?: No Use of a cane, walker or w/c?: No Grab bars in the bathroom?: No Shower chair or bench in shower?: No Elevated toilet seat or a handicapped toilet?: No  TIMED UP AND GO:  Was the test  performed?  No  Cognitive Function: 6CIT completed    07/26/2022    2:39 PM  MMSE - Mini Mental State Exam  Not completed: Refused        04/22/2024    2:17 PM 04/10/2023    1:51 PM 01/14/2019   10:13 AM  6CIT Screen  What Year? 0 points 0 points 0 points  What month? 0 points 0 points 0 points  What time? 0 points 0 points 0 points  Count back from 20 0 points 0 points 0 points  Months in reverse 0 points 0 points 0 points  Repeat phrase 0 points 0 points 0 points  Total Score 0 points 0 points 0 points     Immunizations Immunization History  Administered Date(s) Administered   Fluad Quad(high Dose 65+) 07/08/2019, 06/16/2021, 07/06/2022   Hepatitis A 10/06/1997   Hepatitis A, Adult 12/06/2017   Influenza, High Dose Seasonal PF 07/06/2017, 07/11/2018   Influenza-Unspecified 06/17/2015, 07/11/2018, 06/16/2021   PFIZER(Purple Top)SARS-COV-2 Vaccination 10/04/2019, 11/03/2019, 06/08/2020   Pfizer Covid-19 Vaccine Bivalent Booster 94yrs & up 05/20/2021   Pneumococcal Conjugate-13 09/10/2016   Td 10/06/1997   Tdap 06/17/2015   Typhoid Inactivated 12/07/2017   Unspecified SARS-COV-2 Vaccination 07/06/2022   Zoster Recombinant(Shingrix) 04/23/2021, 09/17/2021    Screening Tests Health Maintenance  Topic Date Due   Colonoscopy  03/30/2023   COVID-19 Vaccine (6 - 2024-25 season) 09/12/2024 (Originally 05/14/2023)   INFLUENZA VACCINE  12/10/2024 (Originally 04/12/2024)   Medicare Annual Wellness (AWV)  04/22/2025   DTaP/Tdap/Td (3 - Td or Tdap) 06/16/2025   Pneumococcal Vaccine: 50+ Years  Completed   Hepatitis C Screening  Completed   Zoster Vaccines- Shingrix  Completed   Hepatitis B Vaccines  Aged Out   HPV VACCINES  Aged Out   Meningococcal B Vaccine  Aged Out    Health Maintenance  Health Maintenance Due  Topic Date Due   Colonoscopy  03/30/2023   Health Maintenance Items Addressed:   Referral sent to GI for colonoscopy for a repeat Colonoscopy w/Dr Elspeth Naval (h/o colon polyps)  Additional Screening:  Vision Screening: Recommended annual ophthalmology exams for early detection of glaucoma and other disorders of the eye. Would you like a referral to an eye doctor? No  Plans to contact [facility] for an appt.  Dental Screening: Recommended annual dental exams for proper oral hygiene  Community Resource Referral / Chronic Care Management: CRR required this visit?  No   CCM required this visit?  No   Plan:    I have personally reviewed and noted the  following in the patient's chart:   Medical and social history Use of alcohol, tobacco or illicit drugs  Current medications and supplements including opioid prescriptions. Patient is not currently taking opioid prescriptions. Functional ability and status Nutritional status Physical activity Advanced directives List of other physicians Hospitalizations, surgeries, and ER visits in previous 12 months Vitals Screenings to include cognitive, depression, and falls Referrals and appointments  In addition, I have reviewed and discussed with patient certain preventive protocols, quality metrics, and best practice recommendations. A written personalized care plan for preventive services as well as general preventive health recommendations were provided to patient.   Verdie CHRISTELLA Saba, CMA   04/22/2024   After Visit Summary: (In Person-Declined) Patient declined AVS at this time.  Notes: Nothing significant to report at this time.

## 2024-04-23 DIAGNOSIS — N401 Enlarged prostate with lower urinary tract symptoms: Secondary | ICD-10-CM | POA: Diagnosis not present

## 2024-04-23 DIAGNOSIS — R3121 Asymptomatic microscopic hematuria: Secondary | ICD-10-CM | POA: Diagnosis not present

## 2024-04-23 DIAGNOSIS — R351 Nocturia: Secondary | ICD-10-CM | POA: Diagnosis not present

## 2024-04-23 DIAGNOSIS — R972 Elevated prostate specific antigen [PSA]: Secondary | ICD-10-CM | POA: Diagnosis not present

## 2024-05-02 DIAGNOSIS — R972 Elevated prostate specific antigen [PSA]: Secondary | ICD-10-CM | POA: Diagnosis not present

## 2024-05-14 ENCOUNTER — Encounter: Payer: Self-pay | Admitting: Emergency Medicine

## 2024-05-15 ENCOUNTER — Other Ambulatory Visit: Payer: Self-pay | Admitting: Urology

## 2024-05-15 DIAGNOSIS — R972 Elevated prostate specific antigen [PSA]: Secondary | ICD-10-CM

## 2024-05-15 DIAGNOSIS — R3121 Asymptomatic microscopic hematuria: Secondary | ICD-10-CM | POA: Diagnosis not present

## 2024-05-29 ENCOUNTER — Other Ambulatory Visit

## 2024-05-30 ENCOUNTER — Encounter: Payer: Self-pay | Admitting: Neurology

## 2024-05-30 ENCOUNTER — Ambulatory Visit: Admitting: Neurology

## 2024-05-30 VITALS — BP 111/61 | HR 72 | Ht 70.0 in | Wt 141.6 lb

## 2024-05-30 DIAGNOSIS — Z9189 Other specified personal risk factors, not elsewhere classified: Secondary | ICD-10-CM

## 2024-05-30 DIAGNOSIS — R0683 Snoring: Secondary | ICD-10-CM

## 2024-05-30 DIAGNOSIS — R0681 Apnea, not elsewhere classified: Secondary | ICD-10-CM

## 2024-05-30 DIAGNOSIS — R351 Nocturia: Secondary | ICD-10-CM

## 2024-05-30 DIAGNOSIS — G4734 Idiopathic sleep related nonobstructive alveolar hypoventilation: Secondary | ICD-10-CM

## 2024-05-30 NOTE — Patient Instructions (Signed)

## 2024-05-30 NOTE — Progress Notes (Signed)
 Subjective:    Patient ID: Cory Thomas is a 73 y.o. male.  HPI    True Mar, MD, PhD [facility] [address] [address] [place], [place] [address]  Dear Dr. Purcell,  I saw your patient, Cory Thomas, upon your kind request in my sleep clinic today for initial consultation of his sleep disorder, in particular, concern for underlying obstructive sleep apnea.  The patient is unaccompanied today.  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  His Epworth sleepiness score is 2 out of 24, fatigue severity score is 17 out of 63.  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  He had a tonsillectomy at age 53.  He typically sleeps in a different bedroom from his wife.  She has mentioned his choking episodes during sleep for the past 10 years.  He has a smart watch and has noticed some oxygen desaturations during sleep on it.  He does not have a TV in his bedroom and no pets in the household.  He has 2 grown stepdaughters, 8 grandchildren and 18 great-grandchildren.  He drinks caffeine in the form of coffee, usually 1 cup in the morning and 2-3 servings of green tea per day.  He has nocturia about 2-3 times per average night, denies recurrent nocturnal or morning headaches.  He stays active, takes multiple supplements, he walks about 5 miles per day.  He reports a history of neuropathy.  He will be requesting a referral for this for general neurology evaluation.  He understands that this is a sleep consultation appointment today. I reviewed your office note from 04/22/2024.  His Past Medical History Is Significant  For: Past Medical History:  Diagnosis Date   Allergy    Anxiety    per pt after father died   Asthma    age 85   Cancer (HCC)    skin on arms and legs years ago rmoved over 10 yrs ago   Gastritis    years ago   Osteoporosis    Refusal of blood product     His Past Surgical History Is Significant For: Past Surgical History:  Procedure Laterality Date   Cataract surgery Bilateral 2023   CLOSED REDUCTION WRIST FRACTURE     73 years old   ESOPHAGOGASTRODUODENOSCOPY N/A 03/05/2020   Procedure: ESOPHAGOGASTRODUODENOSCOPY (EGD);  Surgeon: Teressa Toribio SQUIBB, MD;  Location: [facility];  Service: Endoscopy;  Laterality: N/A;   EUS N/A 03/05/2020   Procedure: UPPER ENDOSCOPIC ULTRASOUND (EUS) LINEAR;  Surgeon: Teressa Toribio SQUIBB, MD;  Location: [facility];  Service: Endoscopy;  Laterality: N/A;   FINE NEEDLE ASPIRATION N/A 03/05/2020   Procedure: FINE NEEDLE ASPIRATION (FNA) LINEAR;  Surgeon: Teressa Toribio SQUIBB, MD;  Location: [facility];  Service: Endoscopy;  Laterality: N/A;   POLYPECTOMY  12/2018   colon   TONSILLECTOMY     WISDOM TOOTH EXTRACTION      His Family History Is Significant For: Family History  Problem Relation Age of Onset   Dementia Father    Colon cancer Neg Hx    Colon polyps Neg Hx    Esophageal cancer Neg Hx    Rectal cancer Neg Hx    Stomach cancer Neg  Hx    Sleep apnea Neg Hx     His Social History Is Significant For: Social History   Socioeconomic History   Marital status: Married    Spouse name: Pat   Number of children: Not on file   Years of education: Not on file   Highest education level: Associate degree: occupational, Scientist, product/process development, or vocational program  Occupational History   Occupation: Radiographer, therapeutic Retired  Tobacco Use   Smoking status: Never   Smokeless tobacco: Never  Vaping Use   Vaping status: Never Used  Substance and Sexual Activity   Alcohol use: Yes    Alcohol/week: 2.0 standard drinks of alcohol    Types: 2 Glasses  of wine per week    Comment: 1-2 bottles of beer/week, red wine   Drug use: Never   Sexual activity: Yes  Other Topics Concern   Not on file  Social History Narrative   Jehovah's Witness   From [place]    Likes to hike   Pt lives with wife    Retired    Chief Executive Officer Drivers of Corporate investment banker Strain: Low Risk  (04/22/2024)   Overall Financial Resource Strain (CARDIA)    Difficulty of Paying Living Expenses: Not hard at all  Food Insecurity: No Food Insecurity (04/22/2024)   Hunger Vital Sign    Worried About Running Out of Food in the Last Year: Never true    Ran Out of Food in the Last Year: Never true  Transportation Needs: No Transportation Needs (04/22/2024)   PRAPARE - Administrator, Civil Service (Medical): No    Lack of Transportation (Non-Medical): No  Physical Activity: Sufficiently Active (04/22/2024)   Exercise Vital Sign    Days of Exercise per Week: 7 days    Minutes of Exercise per Session: 50 min  Stress: No Stress Concern Present (04/22/2024)   [employer] of Occupational Health - Occupational Stress Questionnaire    Feeling of Stress: Not at all  Social Connections: Socially Integrated (04/22/2024)   Social Connection and Isolation Panel    Frequency of Communication with Friends and Family: More than three times a week    Frequency of Social Gatherings with Friends and Family: Twice a week    Attends Religious Services: More than 4 times per year    Active Member of [employer] or Organizations: Yes    Attends Engineer, structural: More than 4 times per year    Marital Status: Married    His Allergies Are:  Allergies  Allergen Reactions   Other     Refuses Blood  :   His Current Medications Are:  Outpatient Encounter Medications as of 05/30/2024  Medication Sig   calcium  carbonate (OS-CAL - DOSED IN MG OF ELEMENTAL CALCIUM ) 1250 (500 Ca) MG tablet Take 1 tablet by mouth.   Cholecalciferol  (VITAMIN D3) 50 MCG (2000 UT) TABS Take  6,000 Units by mouth daily.   Coenzyme Q10 (CO Q 10 PO) Take by mouth.   Cyanocobalamin  (VITAMIN B-12) 2000 MCG TBCR    UNABLE TO FIND Med Name: [place] tail mushroom   zinc gluconate 50 MG tablet Take 25 mg by mouth daily. (Patient taking differently: Take 40 mg by mouth daily.)   Alpha-D-Galactosidase (BEANO) TABS Take 2 tablets by mouth daily. 800 Galu   Zinc Gluconate 10 MG LOZG    No facility-administered encounter medications on file as of 05/30/2024.  :   Review of Systems:  Out of a complete  14 point review of systems, all are reviewed and negative with the exception of these symptoms as listed below:  Review of Systems  Neurological:        Pt here for sleep consult Pt snores    Pt denies fatigue,headaches,hypertension,sleep study ,cpap machine            Objective:  Neurological Exam  Physical Exam Physical Examination:   Vitals:   05/30/24 1216  BP: 111/61  Pulse: 72    General Examination: The patient is a very pleasant 73 y.o. male in no acute distress. He appears well-developed and well-nourished and well groomed.   HEENT: Normocephalic, atraumatic, pupils are equal, round and reactive to light, extraocular tracking is good without limitation to gaze excursion or nystagmus noted. Hearing is grossly intact. Face is symmetric with normal facial animation. Speech is clear with no dysarthria noted. There is no hypophonia. There is no lip, neck/head, jaw or voice tremor. Neck is supple with full range of passive and active motion. There are no carotid bruits on auscultation. Oropharynx exam reveals: mild mouth dryness, adequate dental hygiene and mild airway crowding, due to small airway entry and redundant soft palate, tonsils absent, Mallampati class I, neck circumference 13-1/2 inches, moderate to severe overbite noted.  Tongue protrudes centrally and palate elevates symmetrically.  Chest: Clear to auscultation without wheezing, rhonchi or crackles  noted.  Heart: S1+S2+0, regular and normal without murmurs, rubs or gallops noted.   Abdomen: Soft, non-tender and non-distended.  Extremities: There is no pitting edema in the distal lower extremities bilaterally.   Skin: Warm and dry without trophic changes noted.   Musculoskeletal: exam reveals no obvious joint deformities.   Neurologically:  Mental status: The patient is awake, alert and oriented in all 4 spheres. His immediate and remote memory, attention, language skills and fund of knowledge are appropriate. There is no evidence of aphasia, agnosia, apraxia or anomia. Speech is clear with normal prosody and enunciation. Thought process is linear. Mood is normal and affect is normal.  Cranial nerves II - XII are as described above under HEENT exam.  Motor exam: Normal bulk, strength and tone is noted. There is no obvious action or resting tremor.  Fine motor skills and coordination: grossly intact.  Cerebellar testing: No dysmetria or intention tremor. There is no truncal or gait ataxia.  Sensory exam: intact to light touch in the upper and lower extremities.  Gait, station and balance: He stands easily. No veering to one side is noted. No leaning to one side is noted. Posture is age-appropriate and stance is narrow based. Gait shows normal stride length and normal pace. No problems turning are noted.   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 A laboratory attended sleep study is typically considered gold standard for evaluation of sleep disordered breathing.   I had a  long chat with the patient about my findings and the diagnosis of sleep apnea, particularly OSA, its prognosis and treatment options. We talked about medical/conservative treatments, surgical interventions and non-pharmacological approaches for symptom control. I explained, in particular, the risks and ramifications of untreated moderate to severe OSA, especially with respect to developing cardiovascular  disease down the road, including congestive heart failure (CHF), difficult to treat hypertension, cardiac arrhythmias (particularly A-fib), neurovascular complications including TIA, stroke and dementia. Even type 2 diabetes has, in part, been linked to untreated OSA. Symptoms of untreated OSA may include (but may not be limited to) daytime sleepiness, nocturia (i.e. frequent nighttime urination), memory problems, mood irritability and suboptimally controlled or worsening mood disorder such as depression and/or anxiety, lack of energy, lack of motivation, physical discomfort, as well as recurrent headaches, especially morning or nocturnal headaches. We talked about the importance of maintaining a healthy lifestyle and striving for healthy weight.  In addition, we talked about the importance of striving for and maintaining good sleep hygiene. I recommended a sleep study at this time. I outlined the differences between a laboratory attended sleep study which is considered more comprehensive and accurate over the option of a home sleep test (HST); the latter may lead to underestimation of sleep disordered breathing in some instances and does not help with diagnosing upper airway resistance syndrome and is not accurate enough to diagnose primary central sleep apnea typically. I outlined possible surgical and non-surgical treatment options of OSA, including the use of a positive airway pressure (PAP) device (i.e. CPAP, AutoPAP/APAP or BiPAP in certain circumstances), a custom-made dental device (aka oral appliance,  which would require a referral to a specialist dentist or orthodontist typically, and is generally speaking not considered for patients with full dentures or edentulous state), upper airway surgical options, such as traditional UPPP (which is not considered a first-line treatment) or the Inspire device (hypoglossal nerve stimulator, which would involve a referral for consultation with an ENT surgeon, after careful selection, following inclusion criteria - also not first-line treatment). I explained the PAP treatment option to the patient in detail, as this is generally considered first-line treatment.  The patient indicated that he would be willing to try PAP therapy, if the need arises. I explained the importance of being compliant with PAP treatment, not only for insurance purposes but primarily to improve patient's symptoms symptoms, and for the patient's long term health benefit, including to reduce His cardiovascular risks longer-term.    We will pick up our discussion about the next steps and treatment options after testing.  We will keep him posted as to the test results by phone call and/or MyChart messaging where possible.  We will plan to follow-up in sleep clinic accordingly as well.  I answered all his questions today and the patient was in agreement.   I encouraged him to call with any interim questions, concerns, problems or updates or email us  through MyChart.  Generally speaking, sleep test authorizations may take up to 2 weeks, sometimes less, sometimes longer, the patient is encouraged to get in touch with us  if they do not hear back from the sleep lab staff directly within the next 2 weeks.  Thank you very much for allowing me to participate in the care of this nice patient. If I can be of any further assistance to you please do not hesitate to call me at [phone].  Sincerely,   True Mar, MD, PhD

## 2024-06-04 ENCOUNTER — Telehealth: Payer: Self-pay | Admitting: Neurology

## 2024-06-04 ENCOUNTER — Ambulatory Visit
Admission: RE | Admit: 2024-06-04 | Discharge: 2024-06-04 | Disposition: A | Discharge status: Home / Self Care | Source: Ambulatory Visit | Attending: Urology | Admitting: Urology

## 2024-06-04 DIAGNOSIS — R972 Elevated prostate specific antigen [PSA]: Secondary | ICD-10-CM

## 2024-06-04 MED ORDER — GADOPICLENOL 0.5 MMOL/ML IV SOLN
7.5000 mL | Freq: Once | INTRAVENOUS | Status: AC | PRN
  Administered 2024-06-04: 7.5 mL via INTRAVENOUS

## 2024-06-04 NOTE — Telephone Encounter (Signed)
 [facility] HTA pending

## 2024-06-18 DIAGNOSIS — R399 Unspecified symptoms and signs involving the genitourinary system: Secondary | ICD-10-CM | POA: Diagnosis not present

## 2024-06-19 DIAGNOSIS — N200 Calculus of kidney: Secondary | ICD-10-CM | POA: Diagnosis not present

## 2024-06-19 DIAGNOSIS — R3121 Asymptomatic microscopic hematuria: Secondary | ICD-10-CM | POA: Diagnosis not present

## 2024-06-19 DIAGNOSIS — N401 Enlarged prostate with lower urinary tract symptoms: Secondary | ICD-10-CM | POA: Diagnosis not present

## 2024-06-19 DIAGNOSIS — R972 Elevated prostate specific antigen [PSA]: Secondary | ICD-10-CM | POA: Diagnosis not present

## 2024-06-24 NOTE — Telephone Encounter (Signed)
 [facility] HTA shara: [id number] (exp. 06/04/24 to 09/03/24)

## 2024-07-08 ENCOUNTER — Encounter: Payer: Self-pay | Admitting: Emergency Medicine

## 2024-07-09 ENCOUNTER — Other Ambulatory Visit: Payer: Self-pay

## 2024-07-09 DIAGNOSIS — Z1283 Encounter for screening for malignant neoplasm of skin: Secondary | ICD-10-CM

## 2024-07-09 NOTE — Telephone Encounter (Signed)
 Okay to refer.  Call him and ask him the reason for this referral.  Thanks.

## 2024-07-09 NOTE — Telephone Encounter (Signed)
 Called patient, referral placed for skin cancer screening due to patient verbalizing he has had skin cancer 3 times in past.

## 2024-08-06 ENCOUNTER — Encounter

## 2024-08-26 ENCOUNTER — Ambulatory Visit (INDEPENDENT_AMBULATORY_CARE_PROVIDER_SITE_OTHER): Admitting: Neurology

## 2024-08-26 DIAGNOSIS — R0683 Snoring: Secondary | ICD-10-CM

## 2024-08-26 DIAGNOSIS — G4720 Circadian rhythm sleep disorder, unspecified type: Secondary | ICD-10-CM

## 2024-08-26 DIAGNOSIS — Z9189 Other specified personal risk factors, not elsewhere classified: Secondary | ICD-10-CM

## 2024-08-26 DIAGNOSIS — G4734 Idiopathic sleep related nonobstructive alveolar hypoventilation: Secondary | ICD-10-CM

## 2024-08-26 DIAGNOSIS — R351 Nocturia: Secondary | ICD-10-CM

## 2024-08-26 DIAGNOSIS — G4733 Obstructive sleep apnea (adult) (pediatric): Secondary | ICD-10-CM

## 2024-08-26 DIAGNOSIS — R0681 Apnea, not elsewhere classified: Secondary | ICD-10-CM

## 2024-08-27 ENCOUNTER — Encounter

## 2024-08-27 ENCOUNTER — Encounter: Payer: Self-pay | Admitting: Emergency Medicine

## 2024-08-27 DIAGNOSIS — Z1283 Encounter for screening for malignant neoplasm of skin: Secondary | ICD-10-CM

## 2024-08-27 DIAGNOSIS — R29818 Other symptoms and signs involving the nervous system: Secondary | ICD-10-CM

## 2024-08-27 NOTE — Telephone Encounter (Signed)
 I would need place another referral correct?

## 2024-09-10 ENCOUNTER — Ambulatory Visit: Payer: Self-pay | Admitting: Neurology

## 2024-09-10 DIAGNOSIS — G4733 Obstructive sleep apnea (adult) (pediatric): Secondary | ICD-10-CM

## 2024-09-10 NOTE — Procedures (Signed)
 Physician Interpretation: Please see link under Procedure Tab or under Encounters tab for physician report, technical report, as well as O2 titration and/or PAP titration tables (if applicable).

## 2025-02-17 ENCOUNTER — Ambulatory Visit: Admitting: Physician Assistant

## 2025-04-23 ENCOUNTER — Encounter: Admitting: Emergency Medicine

## 2025-04-23 ENCOUNTER — Ambulatory Visit
